# Patient Record
Sex: Male | Born: 1940 | Race: White | Hispanic: No | Marital: Married | Smoking: Former smoker
Health system: Southern US, Community
[De-identification: ages and names within clinical notes are randomized; demographics above are authoritative.]

## PROBLEM LIST (undated history)

## (undated) DIAGNOSIS — M549 Dorsalgia, unspecified: Secondary | ICD-10-CM

---

## 1998-01-21 ENCOUNTER — Ambulatory Visit (HOSPITAL_COMMUNITY): Admission: RE | Admit: 1998-01-21 | Discharge: 1998-01-21 | Payer: Self-pay | Admitting: Urology

## 1998-10-16 ENCOUNTER — Ambulatory Visit (HOSPITAL_COMMUNITY): Admission: RE | Admit: 1998-10-16 | Discharge: 1998-10-16 | Payer: Self-pay | Admitting: Gastroenterology

## 1999-03-03 ENCOUNTER — Ambulatory Visit (HOSPITAL_COMMUNITY): Admission: RE | Admit: 1999-03-03 | Discharge: 1999-03-03 | Payer: Self-pay | Admitting: Urology

## 1999-11-29 ENCOUNTER — Encounter (HOSPITAL_COMMUNITY): Payer: Self-pay | Admitting: Oncology

## 1999-11-29 ENCOUNTER — Ambulatory Visit (HOSPITAL_COMMUNITY): Admission: RE | Admit: 1999-11-29 | Discharge: 1999-11-29 | Payer: Self-pay | Admitting: Oncology

## 2000-01-15 ENCOUNTER — Ambulatory Visit (HOSPITAL_COMMUNITY): Admission: RE | Admit: 2000-01-15 | Discharge: 2000-01-15 | Payer: Self-pay | Admitting: Gastroenterology

## 2000-01-15 ENCOUNTER — Encounter (INDEPENDENT_AMBULATORY_CARE_PROVIDER_SITE_OTHER): Payer: Self-pay | Admitting: Specialist

## 2003-02-26 ENCOUNTER — Ambulatory Visit (HOSPITAL_COMMUNITY): Admission: RE | Admit: 2003-02-26 | Discharge: 2003-02-26 | Payer: Self-pay | Admitting: Gastroenterology

## 2003-02-26 ENCOUNTER — Encounter (INDEPENDENT_AMBULATORY_CARE_PROVIDER_SITE_OTHER): Payer: Self-pay | Admitting: Specialist

## 2003-08-08 ENCOUNTER — Ambulatory Visit (HOSPITAL_COMMUNITY): Admission: RE | Admit: 2003-08-08 | Discharge: 2003-08-08 | Payer: Self-pay | Admitting: General Surgery

## 2008-05-18 ENCOUNTER — Inpatient Hospital Stay (HOSPITAL_COMMUNITY): Admission: RE | Admit: 2008-05-18 | Discharge: 2008-05-22 | Payer: Self-pay | Admitting: Specialist

## 2009-03-15 ENCOUNTER — Inpatient Hospital Stay (HOSPITAL_COMMUNITY): Admission: RE | Admit: 2009-03-15 | Discharge: 2009-03-18 | Payer: Self-pay | Admitting: Specialist

## 2009-04-12 ENCOUNTER — Encounter: Admission: RE | Admit: 2009-04-12 | Discharge: 2009-04-12 | Payer: Self-pay | Admitting: Specialist

## 2009-05-22 ENCOUNTER — Ambulatory Visit (HOSPITAL_BASED_OUTPATIENT_CLINIC_OR_DEPARTMENT_OTHER): Admission: RE | Admit: 2009-05-22 | Discharge: 2009-05-22 | Payer: Self-pay | Admitting: Orthopedic Surgery

## 2010-10-15 LAB — POCT HEMOGLOBIN-HEMACUE: Hemoglobin: 15 g/dL (ref 13.0–17.0)

## 2010-10-17 LAB — CBC
Hemoglobin: 13.6 g/dL (ref 13.0–17.0)
MCHC: 33.5 g/dL (ref 30.0–36.0)
MCHC: 33.5 g/dL (ref 30.0–36.0)
MCV: 95 fL (ref 78.0–100.0)
MCV: 95.2 fL (ref 78.0–100.0)
Platelets: 186 10*3/uL (ref 150–400)
RBC: 4.14 MIL/uL — ABNORMAL LOW (ref 4.22–5.81)
RBC: 4.24 MIL/uL (ref 4.22–5.81)
RDW: 13.2 % (ref 11.5–15.5)
WBC: 10.3 10*3/uL (ref 4.0–10.5)
WBC: 10.8 10*3/uL — ABNORMAL HIGH (ref 4.0–10.5)

## 2010-10-17 LAB — BASIC METABOLIC PANEL
BUN: 21 mg/dL (ref 6–23)
CO2: 28 mEq/L (ref 19–32)
CO2: 29 mEq/L (ref 19–32)
Calcium: 8.2 mg/dL — ABNORMAL LOW (ref 8.4–10.5)
Calcium: 8.4 mg/dL (ref 8.4–10.5)
Chloride: 104 mEq/L (ref 96–112)
Creatinine, Ser: 1.06 mg/dL (ref 0.4–1.5)
Creatinine, Ser: 1.1 mg/dL (ref 0.4–1.5)
GFR calc Af Amer: >60 mL/min (ref >60)
GFR calc non Af Amer: >60 mL/min (ref >60)
Glucose, Bld: 153 mg/dL — ABNORMAL HIGH (ref 70–99)
Sodium: 133 mEq/L — ABNORMAL LOW (ref 135–145)

## 2010-10-17 LAB — CROSSMATCH: ABO/RH(D): A NEG

## 2010-10-18 LAB — PROTIME-INR
INR: 1 (ref 0.00–1.49)
Prothrombin Time: 13.1 seconds (ref 11.6–15.2)

## 2010-10-18 LAB — COMPREHENSIVE METABOLIC PANEL
BUN: 32 mg/dL — ABNORMAL HIGH (ref 6–23)
CO2: 31 mEq/L (ref 19–32)
Chloride: 104 mEq/L (ref 96–112)
Creatinine, Ser: 1.21 mg/dL (ref 0.4–1.5)
GFR calc non Af Amer: 59 mL/min — ABNORMAL LOW (ref >60)
Total Bilirubin: 1.2 mg/dL (ref 0.3–1.2)

## 2010-10-18 LAB — URINALYSIS, ROUTINE W REFLEX MICROSCOPIC
Ketones, ur: NEGATIVE mg/dL
Nitrite: NEGATIVE
Protein, ur: NEGATIVE mg/dL

## 2010-10-18 LAB — CBC
HCT: 47.4 % (ref 39.0–52.0)
MCHC: 33.7 g/dL (ref 30.0–36.0)
MCV: 95 fL (ref 78.0–100.0)
Platelets: 201 10*3/uL (ref 150–400)
WBC: 8.7 10*3/uL (ref 4.0–10.5)

## 2010-10-18 LAB — DIFFERENTIAL
Basophils Absolute: 0 10*3/uL (ref 0.0–0.1)
Lymphocytes Relative: 18 % (ref 12–46)
Neutro Abs: 6.3 10*3/uL (ref 1.7–7.7)

## 2010-10-18 LAB — APTT: aPTT: 27 seconds (ref 24–37)

## 2010-11-25 NOTE — H&P (Signed)
[name], Norris NO.:  [id number]   MEDICAL RECORD NO.:  [id number]         PATIENT TYPE:  LINP   LOCATION:                               FACILITY:  [facility]   PHYSICIAN:  Erasmo Leventhal, M.D.DATE OF BIRTH:  07-06-1941   DATE OF ADMISSION:  05/18/2008  DATE OF DISCHARGE:                              HISTORY & PHYSICAL   CHIEF COMPLAINTS:  Right knee end-stage osteoarthritis.   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 Due to continued problems he is now  scheduled for total knee arthroplasty.  The surgeries, benefits and  aftercare were discussed in detail with the patient, questions invited  and answered and surgery to go ahead as scheduled.  He has had medical  clearance by Dr. Daphane Shepherd of the [facility].   PAST MEDICAL HISTORY:  Drug allergies:  Codeine with nausea and  vomiting.   CURRENT MEDICATIONS:  1. Meloxicam 15 mg daily.  2. Gabapentin 100 mg 3 tablets t.i.d.  3. Imodium pill, two 2 pills p.r.n. up to 10 pills a day.   PAST SURGICAL HISTORY:  Positive for history of colon resection for  colon cancer, right knee and right shoulder. He has not had any cataract  surgery as of yet, but does have cataracts.   Serious medical illnesses include colon cancer and neuropathy.   FAMILY HISTORY:  Positive for cancer, coronary artery disease and  diabetes.   SOCIAL HISTORY:  The patient is married.  He is retired.  He does not  smoke and does not drink.   REVIEW OF SYSTEMS:  CENTRAL NERVOUS SYSTEM: Negative for headache,  blurred vision or dizziness.  PULMONARY:  Negative for shortness breath,  PND and orthopnea.  CARDIOVASCULAR:  No chest pain or palpitation.  GI:  Negative for ulcers or hepatitis.  Positive for history of colon cancer  with colon resection and diverticulitis.  GU: Negative for urinary tract   difficulty.  MUSCULOSKELETAL:  Positive in HPI.   PHYSICAL EXAM:  VITAL SIGNS:  BP 120/72, respirations 16, pulse 72 and  regular.  GENERAL APPEARANCE:  This is a well-nourished gentleman in no acute  distress.  HEENT:  Head normocephalic.  Nose patent.  Ears patent.  Pupils equal,  round, react to light.  Throat without injection.  NECK:  Supple without adenopathy.  Carotids 2+ without bruit.  CHEST: Clear to auscultation without rales or rhonchi.  Respirations 16.  HEART:  Regular rate and rhythm at 72 beats per without murmur.  ABDOMEN: Soft with active bowel sounds.  No masses, organomegaly.  NEUROLOGIC:  Patient alert and oriented to time, place and person.  Cranial nerves II-XII grossly intact.  EXTREMITIES:  Shows 3 degrees flexion contracture with further flexion  to 135 degrees of varus deformity.  Neurovascular status intact.  Dorsalis pedis, posterior tibialis pulses are 1+.   X-rays show end-stage osteoarthritis right knee.   IMPRESSION:  End-stage osteoarthritis right knee.   PLAN:  Total knee arthroplasty right knee.  Jaquelyn Bitter. Chabon, P.A.    ______________________________  Erasmo Leventhal, M.D.    SJC/MEDQ  D:  05/07/2008  T:  05/07/2008  Job:  [id number]

## 2010-11-25 NOTE — Discharge Summary (Signed)
[name] Norris, [name] Norris              ACCOUNT NO.:  [id number]   MEDICAL RECORD NO.:  [id number]          PATIENT TYPE:  INP   LOCATION:  1616                         FACILITY:  [facility]   PHYSICIAN:  Erasmo Leventhal, M.D.DATE OF BIRTH:  1941/05/15   DATE OF ADMISSION:  05/18/2008  DATE OF DISCHARGE:  05/22/2008                               DISCHARGE SUMMARY   ADMITTING DIAGNOSIS:  End-stage osteoarthritis, right knee.   DISCHARGE DIAGNOSIS:  End-stage osteoarthritis, right knee.   OPERATION:  Total knee arthroplasty right knee.   BRIEF HISTORY:  This is a 70 year old gentleman with a history of end-  stage osteoarthritis in his right knee with failure of conservative  management to alleviate his pain and discomfort.  After discussion of  treatment, benefits, risks and options, the patient now scheduled for  total knee arthroplasty.   LABORATORY VALUES:  Admission CBC within normal limits with the  exception of a high hemoglobin of 17.1, admission urinalysis within  normal limits, admission PT/PTT within normal limits, admitting CMET  showed glucose high at 102, BUN high at 26.  Hemoglobin and hematocrit  reached a low of 13.4 and 40.4 on the ninth, BMET showed mildly elevated  glucose in the 120-140 range through admission.  Repeat urinalysis  showed 30 of protein and 2 urobilinogen.  Chest x-ray on admission  within normal limits, repeat on the ninth within normal limits.   COURSE IN THE HOSPITAL:  The patient tolerated the operative procedure  well.  First postoperative day vital signs were stable, temperature  99.2, hemoglobin and hematocrit normal, dressing was dry, drain was  removed without difficulty and physical therapy and CPM were started.  Second postoperative day vital signs stable, temperature to 102.1, lungs  clear, heart sounds normal, bowel sounds active, some mild erythema  about the wound, calves were negative, he was started on incentive  spirometry cough  and deep breathe.  Third postoperative day he was  feeling good, vital signs stable, temperature to 101.1, O2 - 95 on room  air, WBC 9.6, hemoglobin and hematocrit normal, lungs clear, heart  sounds normal, bowel sounds active, calves negative, wound was benign,  urinalysis was checked to make sure there was not a UTI and there was no  evidence of UTI.  Chest x-ray was also obtained and showed no  atelectasis or abnormality.  On the fourth postoperative day the patient  was feeling good, his vital signs stable, afebrile, chest x-ray normal,  lungs clear, heart sounds normal, bowel sounds active, calves negative,  wound was benign and he had been afebrile through the night.  And the  patient is subsequently discharged home for follow-up in the office.   CONDITION ON DISCHARGE:  Improved.   DISCHARGE MEDICATIONS:  1. Oxycodone 5 mg one to two q.6 hours p.r.n. pain.  2. Robaxin 500 one p.o. q.8 hours p.r.n. spasm.  3. Lovenox 30 mg one shot each day at 7:00 a.m. and 7:00 p.m.  4. Celebrex 200 mg one pill a day until he is off the Lovenox then he      can go back on  his Mobic.   DISCHARGE INSTRUCTIONS:  He is to use his crutches to walk and do his  home therapy.  Keep his wound clean and dry for 3 weeks.  Return to the  office in 2 weeks for follow-up or call sooner p.r.n. problems.   DISCHARGE DIET:  Regular, heart healthy diet.      Jaquelyn Bitter. Chabon, P.A.    ______________________________  Erasmo Leventhal, M.D.    SJC/MEDQ  D:  05/22/2008  T:  05/22/2008  Job:  [id number]

## 2010-11-25 NOTE — H&P (Signed)
NAME:  [name] Norris, [name] Norris             ACCOUNT NO.:  [id number]   MEDICAL RECORD NO.:  [id number]         PATIENT TYPE:  LINP   LOCATION:                               FACILITY:  [facility]   PHYSICIAN:  Robert Andrew Collins, M.D.DATE OF BIRTH:  06/25/1941   DATE OF ADMISSION:  05/18/2008  DATE OF DISCHARGE:                              HISTORY & PHYSICAL   CHIEF COMPLAINTS:  Right knee end-stage osteoarthritis.   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 Due to continued problems he is now  scheduled for total knee arthroplasty.  The surgeries, benefits and  aftercare were discussed in detail with the patient, questions invited  and answered and surgery to go ahead as scheduled.  He has had medical  clearance by Dr. Meyer of the [facility].   PAST MEDICAL HISTORY:  Drug allergies:  Codeine with nausea and  vomiting.   CURRENT MEDICATIONS:  1. Meloxicam 15 mg daily.  2. Gabapentin 100 mg 3 tablets t.i.d.  3. Imodium pill, two 2 pills p.r.n. up to 10 pills a day.   PAST SURGICAL HISTORY:  Positive for history of colon resection for  colon cancer, right knee and right shoulder. He has not had any cataract  surgery as of yet, but does have cataracts.   Serious medical illnesses include colon cancer and neuropathy.   FAMILY HISTORY:  Positive for cancer, coronary artery disease and  diabetes.   SOCIAL HISTORY:  The patient is married.  He is retired.  He does not  smoke and does not drink.   REVIEW OF SYSTEMS:  CENTRAL NERVOUS SYSTEM: Negative for headache,  blurred vision or dizziness.  PULMONARY:  Negative for shortness breath,  PND and orthopnea.  CARDIOVASCULAR:  No chest pain or palpitation.  GI:  Negative for ulcers or hepatitis.  Positive for history of colon cancer  with colon resection and diverticulitis.  GU: Negative for urinary tract   difficulty.  MUSCULOSKELETAL:  Positive in HPI.   PHYSICAL EXAM:  VITAL SIGNS:  BP 120/72, respirations 16, pulse 72 and  regular.  GENERAL APPEARANCE:  This is a well-nourished gentleman in no acute  distress.  HEENT:  Head normocephalic.  Nose patent.  Ears patent.  Pupils equal,  round, react to light.  Throat without injection.  NECK:  Supple without adenopathy.  Carotids 2+ without bruit.  CHEST: Clear to auscultation without rales or rhonchi.  Respirations 16.  HEART:  Regular rate and rhythm at 72 beats per without murmur.  ABDOMEN: Soft with active bowel sounds.  No masses, organomegaly.  NEUROLOGIC:  Patient alert and oriented to time, place and person.  Cranial nerves II-XII grossly intact.  EXTREMITIES:  Shows 3 degrees flexion contracture with further flexion  to 135 degrees of varus deformity.  Neurovascular status intact.  Dorsalis pedis, posterior tibialis pulses are 1+.   X-rays show end-stage osteoarthritis right knee.   IMPRESSION:  End-stage osteoarthritis right knee.   PLAN:  Total knee arthroplasty right knee.        Stephen J. Chabon, P.A.    ______________________________  Robert Andrew Collins, M.D.    SJC/MEDQ  D:  05/07/2008  T:  05/07/2008  Job:  [id number] 

## 2010-11-25 NOTE — Op Note (Signed)
NAMEDAVION, FLANNERY              ACCOUNT NO.:  [id number]   MEDICAL RECORD NO.:  [id number]          PATIENT TYPE:  INP   LOCATION:  1616                         [facility]ACILITY:  [facility]   PHYSICIAN:  Erasmo Leventhal, M.D.DATE OF BIRTH:  03-15-1941   DATE OF PROCEDURE:  05/18/2008  DATE OF DISCHARGE:                               OPERATIVE REPORT   PREOPERATIVE DIAGNOSIS:  Right knee end-stage osteoarthritis.   POSTOPERATIVE DIAGNOSIS:  Right knee end-stage osteoarthritis.   PROCEDURE:  Right total knee arthroplasty.   SURGEON:  Dr. Valma Cava.   ASSISTANT:  Jodene Nam, PA-C.   ANESTHESIA:  Spinal monitored anesthesia.   BLOOD LOSS:  Less than 150 mL.   DRAINS:  One medium Hemovac.   COMPLICATIONS:  None.   TOURNIQUET TIME:  70 minutes at 300 mmHg.   DISPOSITION:  To PACU stable.   OPERATIVE DETAILS:  The family and patient were counseled in the holding  area and patient and surgery site identified.  IV was started.  The  patient was taken to the operating room where spinal anesthetic was  administered.  IV antibiotics were given.  Foley catheter placed  utilizing sterile technique by OR circulating nurse.  Right lower  extremity elevated and prepped with DuraPrep and draped in sterile  fashion.  The Esmarch tourniquet was inflated to for 300 mmHg.  Straight  midline incision was made in the skin and subcutaneous tissue.  Medial  soft tissue flaps were developed.  Medial parapatellar arthrotomy was  performed.  Proximal medial tibia soft tissue release was done.  Knee  was flexed.  End-stage arthritis changes and severe chondrocalcinosis  osteoarthritis were removed.  Cruciate ligaments were resected.  A  starter hole was made in distal femur.  Canal was irrigated and the  effluent was clear.  Intramedullary rod was gently placed.  I chose a 5-  degree valgus cut and it took a 10-mm cup to the distal femur due to the  flexion contracture.  This was found be a  size #5.  Rotational marks  were made and set appropriately and a cutting block was applied.  With  bur size #5 medial and lateral meniscal remnants were removed.  Proximal  osteophytes were removed from proximal tibia.  Utilizing extramedullary  alignment rod we accepted  a  10 mm cut off the least deficient side  which was the lateral side and had a 0-degree slope.  Posteromedial and  posterior femoral osteophytes removed under direct visualization.  Posterior  intravascular structures were tied  off and protected  throughout the entire case.  Flexion/extension blocks were 12.5 with  well-balanced flexion and extension.  The knee was then flexed to the  base and plate was applied.  Rotation coverage were set for size #5.  Reamer and punch was performed and femoral box cut for was performed,  and fit with a size-5 femur and size-5 tibia.  A 12.5-posterior  stabilized rotating platform tibial insert was implanted. We had well  balanced flexion- extension gaps and was stable to varus and valgus  stress throughout 0-90 degrees of flexion.  Patella was found be a size  #41 and appropriate bone resected.  Lock holes were made in the patella  that was supplied and patellofemoral track was anatomic.  All trials  were then removed.  The knee was irrigated with pulsatile lavage.  Cement was mixed utilizing modern  cement technique d all components  were cemented in place, size-5 tibia, size-5 femur, size-41 patella.  After cemented had cured and excess had been removed with trials in  place, we had well balanced 15 mm.  The knee was then flexed and  irrigated again and a 15-mm posterior stabilized rotating platform  tibial insert was implanted.  The medium Hemovac drain was placed.  The  knee was checked, flexed in extension and was well balanced.  Full  extension and varus valgus stress was stable.  Sequential closure was  done.  The arthrotomy was closed at 90 degrees at flexion with Vicryl   figure-of-eight, the subcu with Vicryl and the skin with a subcuticular  with Monopril suture.  Steri-Strips were applied.  Drains were hooked to  suction.  A sterile compressive dressing was applied.  The tourniquet  was deflated with normal circulation of the foot and ankle at the end of  the case.  Another gram of Ancef was given intravenously.  The patient  tolerated the procedure well.  There were no complications or problems.  He was taken from the operating room to PACU  in stable condition.   Help with surgical technique and decision making:  Mr. Jodene Nam,  PA-C assistance was needed throughout the entire case.   Implants implanted:  [employer] Sigma.  Size-5 femur, size-  5 tibia, size-41-mm patella and a 15-mm posterior stabilized rotating  platform tibial insert.           ______________________________  Erasmo Leventhal, M.D.     RAC/MEDQ  D:  05/18/2008  T:  05/18/2008  Job:  [id number]

## 2010-11-25 NOTE — H&P (Signed)
[name] Norris, [name] Norris              ACCOUNT NO.:  [id number]   MEDICAL RECORD NO.:  [id number]          PATIENT TYPE:  INP   LOCATION:  NA                           FACILITY[facility]   PHYSICIAN:  Erasmo Leventhal, M.D.DATE OF BIRTH:  12/17/1940   DATE OF ADMISSION:  03/15/2009  DATE OF DISCHARGE:                              HISTORY & PHYSICAL   CHIEF COMPLAINT:  Painful range of motion, left knee.   HISTORY OF PRESENT ILLNESS:  [name] Norris is a 70 year old gentleman with  painful range of motion of his left knee.  He has failed conservative  treatment.  Patient has elected to proceed with a total knee  arthroplasty on his left.  He has recently gone through a total knee  arthroplasty on his right with excellent results.  Patient's x-rays  reveal he has tricompartmental arthritic changes with bone-on-bone  medial compartment with a varus deformity.   ALLERGIES:  1. CODEINE CAUSING NAUSEA.  2. PERCOCET CAUSING VERTIGO.  3. LATEX ALLERGY CAUSING ITCHING AND RASH.  4. LAUNDRY SOAPS AND DYES CAUSING ITCHING AND RASH.   CURRENT MEDICATIONS:  1. Gabapentin 300 three times a day.  2. 2.  Meloxicam.  3. Imodium.  4. Fish oil.  5. Glucosamine chondroitin.  6. Vitamin C and B supplements.  7. Multivitamins.  8. Calcium and vitamin D.  9. Black/red cherry and orange juice.   PRIMARY CARE PHYSICIAN:  Dr. Lenise Arena with [facility].   REVIEW OF SYSTEMS:  NEUROLOGIC:  He does have some visual issues.  He  has also had vertigo in the past related to his oxycodone medication.  He also has cataracts.  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  CARDIOVASCULAR:  Unremarkable.  GI:  He does have a history of diverticulosis in the past  which was treated when he had his colon resection due to colon cancer.  GU:  He does have some urinary incontinence but no recent infections or  kidney stones.  ENDOCRINE:  Unremarkable.   He has had a transfusion in  the past in 1997 with his colon surgery.   PAST SURGICAL HISTORY:  Includes:  1. Colon resection due to colon cancer.  2. Right shoulder and knee surgery.  3. He had a right total knee arthroplasty.  No problems with      anesthesia in the past.   FAMILY MEDICAL HISTORY:  Father has a history of colon cancer.  Mother  has a history of heart disease.  [name] Norris is diabetic.  [name] Norris is  diabetic.   SOCIAL HISTORY:  The patient is currently married, lives with his [name] Norris  in a 1-story house.  He is a retired [employer].  He has smoked in the  past.  He occasionally has an alcoholic beverage.  He has 2 grown  children.   PHYSICAL EXAM:  VITALS:  Height is 5 feet 10.  Weight is 168 pounds.  Blood pressure is 148/72.  Pulse of 74 and regular.  Respirations 12.  Patient is afebrile.  GENERAL:  This is a healthy-appearing, well-developed gentleman,  conscious, alert, and appropriate.  Appears to be a good historian.  Easily gets on and off the exam table.  HEENT:  Head was normocephalic.  Pupils equal, round, and reactive.  NECK:  Supple.  No palpable lymphadenopathy.  Good range of motion.  CHEST:  Lung sounds were clear and equal bilaterally.  No wheezes,  rales, rhonchi.  HEART:  Regular rate and rhythm.  No murmurs.  ABDOMEN:  Soft, nontender.  Bowel sounds present.  EXTREMITIES:  Upper extremities had excellent range of motion without  any difficulty.  Good motor strength.  Lower extremities, he had good  range of motion in both hips.  His right knee had full extension and he  was able to flex it back to 130.  Well-healed anterior surgical  incision.  His left knee, he lacked about 5 degrees extension.  He was  able to flex it back to about 120.  He had an obvious varus deformity.  No signs of infection.  His calves were soft and nontender.  No signs of  phlebitis.  NEURO:  Patient was conscious, alert, and appropriate.  PERIPHERAL VASCULAR:  Carotid  pulses were 2+.  Radial pulses 2+.  Posterior tibial pulses were 2+.  BREAST:  Exam was deferred.  RECTAL:  Exam was deferred.  GU:  Exam was deferred.   IMPRESSION:  1. End-stage osteoarthritis, left knee, with bone-on-bone medial      compartment.  2. 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  3. History of colon cancer, status post resection.  4. Some urinary incontinence.  5. History of vertigo with previous oxycodone use.   PLAN:  Patient will undergo all routine labs and tests prior to having a  left total knee arthroplasty by Dr. Thomasena Edis at [facility] on  March 15, 2009.      Jamelle Rushing, P.A.    ______________________________  Erasmo Leventhal, M.D.    RWK/MEDQ  D:  03/04/2009  T:  03/04/2009  Job:  [id number]   cc:   Erasmo Leventhal, M.D.  Fax: [phone]

## 2010-11-28 NOTE — Procedures (Signed)
[facility]  Patient:    [name] Norris, [name] Norris                       MRN: [id number] Proc. Date: 01/15/00 Attending:  Verlin Grills, M.D. CC:         Samul Dada, M.D.             Chales Salmon. Abigail Miyamoto, M.D.             Angelia Mould. Derrell Lolling, M.D.                           Procedure Report  REFERRING PHYSICIAN: 1. Samul Dada, M.D. 2. Chales Salmon. Abigail Miyamoto, M.D.  PROCEDURE PERFORMED:  Colonoscopy with biopsy.  ENDOSCOPIST:  Verlin Grills, M.D.  INDICATIONS FOR PROCEDURE:  [name] Norris is a 70 year old male.  In 1992 he underwent a segmental sigmoid colon resection to treat a perforated sigmoid diverticulum.  In 1997 he underwent a segmental transverse colon resection to remove an adenocarcinoma of the colon.  [name] Norris is scheduled for surveillance colonoscopy to remove neoplastic colon polyps to prevent recurrent colon cancer.  I discussed with [name] Norris the complications associated with colonoscopy and polypectomy including intestinal bleeding and intestinal perforation. [name] Norris has signed the operative permit.  PREMEDICATION:  Demerol 50 mg, Versed 5 mg.  ENDOSCOPE:  Pediatric Olympus colonoscope.  DESCRIPTION OF PROCEDURE:  After obtaining informed consent, the patient was placed in the left lateral decubitus position.  I administered intravenous Demerol and intravenous Versed to achieve conscious sedation for the procedure.  The patients blood pressure, oxygen saturation and cardiac rhythm were monitored throughout the procedure and documented in the medical record.  Anal inspection was normal.  Digital rectal exam revealed a nonnodular prostate.  The Olympus pediatric video colonoscope was then introduced into the rectum and under direct vision, advanced to the cecum as identified by a normal-appearing ileocecal valve.  The ileocecal valve was intubated and the distal 10 cm of ileum inspected.  Colonic  preparation for the exam today was satisfactory.  Rectum:  Normal.  Sigmoid colon, descending colon, and left colonic anastomosis:  There are a few small colonic diverticula.  Biopsies were taken from the left colonic anastomosis which appears patent and normal.  Splenic flexure:  Normal.  Transverse colon:  Biopsies were taken from the anastomosis in the right colon which is patent and appears unremarkable.  Hepatic flexure:  Normal.  Ascending colon:  Normal.  Cecum and ileocecal valve:  Normal.  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  Biopsies taken from the right surgical anastomosis and the left colon surgical anastomosis are pending.  The patient does have small diverticula in the remaining left colon.  RECOMMENDATIONS:  Repeat colonoscopy in July 2004. DD:  01/15/00 TD:  01/15/00 Job: [id number]

## 2010-11-28 NOTE — Op Note (Signed)
[name] Norris, [name] Norris                        ACCOUNT NO.:  [id number]   MEDICAL RECORD NO.:  [id number]                   PATIENT TYPE:  OIB   LOCATION:  NA                                   FACILITY:  [facility]   PHYSICIAN:  Angelia Mould. Derrell Lolling, M.D.             DATE OF BIRTH:  1940-11-17   DATE OF PROCEDURE:  08/08/2003  DATE OF DISCHARGE:                                 OPERATIVE REPORT   PREOPERATIVE DIAGNOSIS:  Left inguinal hernia.   POSTOPERATIVE DIAGNOSIS:  Left inguinal hernia.   OPERATION PERFORMED:  Repair of left inguinal hernia with mesh Armanda Heritage  repair).   SURGEON:  Angelia Mould. Derrell Lolling, M.D.   OPERATIVE INDICATIONS:  This is a 70 year old white man who recently about 2  months ago felt something pop in his left groin and then felt a bulge and  some burning discomfort.  This has been bothersome on a daily basis.  On  exam, he has a moderately large left inguinal hernia.  He has a right groin  scar from previous hernia surgery, but there is no evidence of recurrence.  He has a large abdominal incision from a ventral herniorrhaphy, and there is  no ventral hernia.  He is brought to the operating room electively for  repair of his left inguinal hernia.   OPERATIVE TECHNIQUE:  Following the induction of a general LMA anesthesia,  the patient's left groin was prepped and draped in a sterile fashion.  0.5%  Marcaine with epinephrine was used as a local infiltration anesthetic.  An  oblique incision was made in the left groin overlying the inguinal canal.  Dissection was carried down through the subcutaneous tissue, exposing the  aponeurosis of the external oblique.  The external oblique was incised in  the direction of its fibers, opening up the external inguinal ring.  A small  sensory nerve consistent of variant of iliohypogastric nerve was intimately  involved in the cord and the inguinal canal and was dissected back to its  penetration of the muscle, clamped, divided,  and ligated with a 2-0 silk  tie.  Self-retaining retractors were placed.  The cord structures were  mobilized and encircled with a Penrose drain.  I found that the patient had  a direct inguinal hernia and an indirect inguinal hernia.  The direct hernia  had fatty tissue protruding through the direct hernia space.  This was  cleaned off and then reduced and the soft tissues oversewn with a figure-of-  eight suture of 2-0 Vicryl.  The indirect hernia sac was dissected away from  the cord structures all the way back to the internal ring.  The hernia sac  was opened and inspected.  The sac was empty and found to be a simple sac.  It was then twisted and suture ligated at the level of the internal ring  with a suture ligature of 2-0 silk.  A lipoma of the cord  was also resected.  The floor of the inguinal canal was repaired and reinforced with an onlay  graft of polypropylene mesh.  A 3 inch x 6 inch of polypropylene mesh was  used and trimmed at the corners.  The mesh was sutured in place with running  sutures of 2-0 Prolene.  The mesh was sutured so as to generously overlap  the fascia at the pubic tubercle, along the inguinal ligament inferiorly and  along the conjoined tendon and internal oblique superiorly.  The mesh was  incised laterally so as to wrap around the cord structures at the internal  ring.  The tails of the mesh were overlapped laterally and the suture lines  completed.  A couple of extra sutures of 2-0 Prolene were placed to snug up  the mesh around the cord structures.  This created a very secure repair,  both medial and lateral to the internal ring but allowed an adequate opening  for the cord structures.  The wound was irrigated with saline.  Hemostasis  was excellent.  The external oblique was closed with a running suture of 2-0  Vicryl and placed in the cord structures deep to the external oblique.  The  Scarpa's fascia was closed with 3-0 Vicryl and the skin closed with  a  running subcuticular suture of 4-0 Vicryl and Steri-Strips.  Clean bandages  were placed and the patient taken to the recovery room in a stable  condition.  Estimated blood loss was about 10 mL.   COMPLICATIONS:  None.   SPONGE, NEEDLE, AND INSTRUMENT COUNTS:  Correct.                                               Angelia Mould. Derrell Lolling, M.D.    HMI/MEDQ  D:  08/08/2003  T:  08/08/2003  Job:  [id number]   cc:   Chales Salmon. Abigail Miyamoto, M.D.  [address]  [place]  [place] [address]  Fax: [phone]

## 2010-11-28 NOTE — Op Note (Signed)
NAMEBRAYEN, BUNN                        ACCOUNT NO.:  [id number]   MEDICAL RECORD NO.:  [id number]                   PATIENT TYPE:  AMB   LOCATION:  ENDO                                 FACILITY:  [facility]   PHYSICIAN:  [name], M.D.                DATE OF BIRTH:  10-24-1940   DATE OF PROCEDURE:  02/26/2003  DATE OF DISCHARGE:                                 OPERATIVE REPORT   PROCEDURE:  Screening colonoscopy with polypectomy.   ENDOSCOPIST:  [name] Norris, M.D.   INDICATIONS FOR PROCEDURE:  [name] Norris is a 70 year old male born  [birth year].  In 1992, [name] Norris underwent a segmental sigmoid colon  resection to treat a perforated sigmoid diverticulum.  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  [name] Norris is here for a surveillance colonoscopy with polypectomy to prevent  recurrent colon cancer.   PREMEDICATION:  Versed 5mg , Demerol 60 mg.   DESCRIPTION OF PROCEDURE:  After obtaining the informed consent, [name] Norris  was placed in the left lateral decubitus position.  I administered  intravenous Demerol and intravenous Versed to achieve conscious sedation  before the procedure.  The patient's blood pressure, oxygen saturation, and  cardiac rhythm were monitored throughout the procedure and documented in the  medical record.   Anal inspection was normal.  Digital rectal exam revealed a small nonnodular  prostate.  The Olympus adult colonoscope was introduced into the rectum and  easily advanced to the cecum.  A normal appearing ileocecal valve was  intubated, and the distal ileum inspected.  Colonic preparation for the exam  today was excellent.   Rectum normal.   Sigmoid colon and descending colon:  Normal post segmental sigmoid colon  resection for perforated diverticulum.  Diverticula are present in the left  colon.  At 40 cm from the anal verge, a 2 mm sessile polyp was removed with  electrocautery snare and submitted for pathologic interpretation.   Splenic flexure normal.   Transverse colon normal.   Hepatic flexure normal.   Ascending colon normal.   Cecum and ileocecal valve normal.   Distal ileum normal.   ASSESSMENT:  1. In 1992, segmental sigmoid colon resection for perforated diverticulum.  2. 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  3. Colonic diverticulosis.  4. From the left colon at 40 cm from the anal verge, a 2 mm sessile polyp     was removed without electrocautery snare.   RECOMMENDATIONS:  Repeat surveillance colonoscopy in 5 years.                                               [name], M.D.    MJ/MEDQ  D:  02/26/2003  T:  02/26/2003  Job:  [id number]   cc:   Angelia Mould. Derrell Lolling, M.D.  [address], [address]  [place]  [place] [address]  Fax: [phone]   Chales Salmon. Abigail Miyamoto, M.D.  [address]  [place]  [place] [address]  Fax: [phone]   Samul Dada, M.D.  [address] [facility]  [place]  [place] [address]  Fax: [phone]

## 2011-04-14 LAB — DIFFERENTIAL
Basophils Relative: 1
Eosinophils Absolute: 0.1
Eosinophils Relative: 1
Lymphs Abs: 1.1
Monocytes Absolute: 0.6
Monocytes Relative: 8

## 2011-04-14 LAB — URINALYSIS, ROUTINE W REFLEX MICROSCOPIC
Bilirubin Urine: NEGATIVE
Glucose, UA: NEGATIVE
Hgb urine dipstick: NEGATIVE
Ketones, ur: NEGATIVE
Leukocytes, UA: NEGATIVE
Nitrite: NEGATIVE
Urobilinogen, UA: 0.2
pH: 6.5
pH: 7

## 2011-04-14 LAB — CBC
HCT: 39.6
HCT: 40
Hemoglobin: 13.1
Hemoglobin: 13.3
Hemoglobin: 13.4
MCHC: 33
MCHC: 33.3
MCHC: 34.2
MCV: 96
RBC: 4.17 — ABNORMAL LOW
RBC: 4.21 — ABNORMAL LOW
RBC: 5.33
RDW: 13.2
RDW: 13.7
WBC: 9.6

## 2011-04-14 LAB — BASIC METABOLIC PANEL
BUN: 16
CO2: 27
CO2: 28
Calcium: 8.1 — ABNORMAL LOW
Calcium: 8.1 — ABNORMAL LOW
Calcium: 8.4
Chloride: 103
Chloride: 103
Creatinine, Ser: 1.19
GFR calc Af Amer: >60
GFR calc Af Amer: >60
Glucose, Bld: 123 — ABNORMAL HIGH
Potassium: 4.4
Sodium: 135
Sodium: 135
Sodium: 137

## 2011-04-14 LAB — URINE MICROSCOPIC-ADD ON

## 2011-04-14 LAB — CROSSMATCH

## 2011-04-14 LAB — COMPREHENSIVE METABOLIC PANEL
ALT: 23
AST: 20
Alkaline Phosphatase: 48
Calcium: 10.3
GFR calc Af Amer: >60
Potassium: 4.9
Sodium: 141
Total Protein: 6.8

## 2011-04-14 LAB — CULTURE, BLOOD (ROUTINE X 2)

## 2011-04-14 LAB — PROTIME-INR: INR: 1

## 2011-12-16 ENCOUNTER — Other Ambulatory Visit: Payer: Self-pay

## 2011-12-28 ENCOUNTER — Other Ambulatory Visit: Payer: Self-pay

## 2014-07-23 DIAGNOSIS — R413 Other amnesia: Secondary | ICD-10-CM | POA: Diagnosis not present

## 2014-07-23 DIAGNOSIS — M159 Polyosteoarthritis, unspecified: Secondary | ICD-10-CM | POA: Diagnosis not present

## 2014-07-23 DIAGNOSIS — R5382 Chronic fatigue, unspecified: Secondary | ICD-10-CM | POA: Diagnosis not present

## 2014-12-20 DIAGNOSIS — R5382 Chronic fatigue, unspecified: Secondary | ICD-10-CM | POA: Diagnosis not present

## 2014-12-20 DIAGNOSIS — R413 Other amnesia: Secondary | ICD-10-CM | POA: Diagnosis not present

## 2014-12-20 DIAGNOSIS — F419 Anxiety disorder, unspecified: Secondary | ICD-10-CM | POA: Diagnosis not present

## 2014-12-20 DIAGNOSIS — H1132 Conjunctival hemorrhage, left eye: Secondary | ICD-10-CM | POA: Diagnosis not present

## 2014-12-20 DIAGNOSIS — M2550 Pain in unspecified joint: Secondary | ICD-10-CM | POA: Diagnosis not present

## 2014-12-24 DIAGNOSIS — Z1389 Encounter for screening for other disorder: Secondary | ICD-10-CM | POA: Diagnosis not present

## 2014-12-24 DIAGNOSIS — L639 Alopecia areata, unspecified: Secondary | ICD-10-CM | POA: Diagnosis not present

## 2014-12-24 DIAGNOSIS — N183 Chronic kidney disease, stage 3 (moderate): Secondary | ICD-10-CM | POA: Diagnosis not present

## 2014-12-28 DIAGNOSIS — L639 Alopecia areata, unspecified: Secondary | ICD-10-CM | POA: Diagnosis not present

## 2014-12-28 DIAGNOSIS — N183 Chronic kidney disease, stage 3 (moderate): Secondary | ICD-10-CM | POA: Diagnosis not present

## 2015-01-22 DIAGNOSIS — R413 Other amnesia: Secondary | ICD-10-CM | POA: Diagnosis not present

## 2015-02-01 DIAGNOSIS — L639 Alopecia areata, unspecified: Secondary | ICD-10-CM | POA: Diagnosis not present

## 2015-02-01 DIAGNOSIS — H539 Unspecified visual disturbance: Secondary | ICD-10-CM | POA: Diagnosis not present

## 2015-02-01 DIAGNOSIS — M159 Polyosteoarthritis, unspecified: Secondary | ICD-10-CM | POA: Diagnosis not present

## 2015-02-01 DIAGNOSIS — N183 Chronic kidney disease, stage 3 (moderate): Secondary | ICD-10-CM | POA: Diagnosis not present

## 2015-02-28 DIAGNOSIS — G8929 Other chronic pain: Secondary | ICD-10-CM | POA: Diagnosis not present

## 2015-02-28 DIAGNOSIS — M159 Polyosteoarthritis, unspecified: Secondary | ICD-10-CM | POA: Diagnosis not present

## 2015-03-12 DIAGNOSIS — H52223 Regular astigmatism, bilateral: Secondary | ICD-10-CM | POA: Diagnosis not present

## 2015-03-12 DIAGNOSIS — H5210 Myopia, unspecified eye: Secondary | ICD-10-CM | POA: Diagnosis not present

## 2015-03-20 ENCOUNTER — Ambulatory Visit: Payer: Commercial Managed Care - HMO | Attending: Psychology | Admitting: Psychology

## 2015-04-02 DIAGNOSIS — H43811 Vitreous degeneration, right eye: Secondary | ICD-10-CM | POA: Diagnosis not present

## 2015-04-02 DIAGNOSIS — H18413 Arcus senilis, bilateral: Secondary | ICD-10-CM | POA: Diagnosis not present

## 2015-04-02 DIAGNOSIS — H26492 Other secondary cataract, left eye: Secondary | ICD-10-CM | POA: Diagnosis not present

## 2015-04-02 DIAGNOSIS — Z961 Presence of intraocular lens: Secondary | ICD-10-CM | POA: Diagnosis not present

## 2015-04-04 DIAGNOSIS — N401 Enlarged prostate with lower urinary tract symptoms: Secondary | ICD-10-CM | POA: Diagnosis not present

## 2015-04-09 DIAGNOSIS — H26492 Other secondary cataract, left eye: Secondary | ICD-10-CM | POA: Diagnosis not present

## 2015-05-28 DIAGNOSIS — M25551 Pain in right hip: Secondary | ICD-10-CM | POA: Diagnosis not present

## 2015-05-28 DIAGNOSIS — M7061 Trochanteric bursitis, right hip: Secondary | ICD-10-CM | POA: Diagnosis not present

## 2015-05-28 DIAGNOSIS — M159 Polyosteoarthritis, unspecified: Secondary | ICD-10-CM | POA: Diagnosis not present

## 2015-06-18 DIAGNOSIS — N183 Chronic kidney disease, stage 3 (moderate): Secondary | ICD-10-CM | POA: Diagnosis not present

## 2015-06-18 DIAGNOSIS — M159 Polyosteoarthritis, unspecified: Secondary | ICD-10-CM | POA: Diagnosis not present

## 2015-07-11 ENCOUNTER — Emergency Department (HOSPITAL_COMMUNITY): Payer: Commercial Managed Care - HMO

## 2015-07-11 ENCOUNTER — Encounter (HOSPITAL_COMMUNITY): Payer: Self-pay

## 2015-07-11 ENCOUNTER — Emergency Department (HOSPITAL_COMMUNITY)
Admission: EM | Admit: 2015-07-11 | Discharge: 2015-07-11 | Disposition: A | Discharge status: Home / Self Care | Payer: Commercial Managed Care - HMO | Attending: Physician Assistant | Admitting: Physician Assistant

## 2015-07-11 DIAGNOSIS — Y9289 Other specified places as the place of occurrence of the external cause: Secondary | ICD-10-CM | POA: Diagnosis not present

## 2015-07-11 DIAGNOSIS — Z23 Encounter for immunization: Secondary | ICD-10-CM | POA: Insufficient documentation

## 2015-07-11 DIAGNOSIS — W108XXA Fall (on) (from) other stairs and steps, initial encounter: Secondary | ICD-10-CM | POA: Insufficient documentation

## 2015-07-11 DIAGNOSIS — S0993XA Unspecified injury of face, initial encounter: Secondary | ICD-10-CM | POA: Diagnosis present

## 2015-07-11 DIAGNOSIS — W19XXXA Unspecified fall, initial encounter: Secondary | ICD-10-CM

## 2015-07-11 DIAGNOSIS — S01511A Laceration without foreign body of lip, initial encounter: Secondary | ICD-10-CM | POA: Diagnosis not present

## 2015-07-11 DIAGNOSIS — S199XXA Unspecified injury of neck, initial encounter: Secondary | ICD-10-CM | POA: Diagnosis not present

## 2015-07-11 DIAGNOSIS — Y998 Other external cause status: Secondary | ICD-10-CM | POA: Insufficient documentation

## 2015-07-11 DIAGNOSIS — Y9301 Activity, walking, marching and hiking: Secondary | ICD-10-CM | POA: Insufficient documentation

## 2015-07-11 DIAGNOSIS — Z79899 Other long term (current) drug therapy: Secondary | ICD-10-CM | POA: Diagnosis not present

## 2015-07-11 DIAGNOSIS — S0990XA Unspecified injury of head, initial encounter: Secondary | ICD-10-CM | POA: Diagnosis not present

## 2015-07-11 DIAGNOSIS — T148 Other injury of unspecified body region: Secondary | ICD-10-CM | POA: Diagnosis not present

## 2015-07-11 DIAGNOSIS — Z7951 Long term (current) use of inhaled steroids: Secondary | ICD-10-CM | POA: Insufficient documentation

## 2015-07-11 DIAGNOSIS — Z91040 Latex allergy status: Secondary | ICD-10-CM | POA: Insufficient documentation

## 2015-07-11 DIAGNOSIS — M25552 Pain in left hip: Secondary | ICD-10-CM | POA: Diagnosis not present

## 2015-07-11 DIAGNOSIS — Z87891 Personal history of nicotine dependence: Secondary | ICD-10-CM | POA: Insufficient documentation

## 2015-07-11 DIAGNOSIS — S0093XA Contusion of unspecified part of head, initial encounter: Secondary | ICD-10-CM | POA: Diagnosis not present

## 2015-07-11 DIAGNOSIS — M25551 Pain in right hip: Secondary | ICD-10-CM | POA: Diagnosis not present

## 2015-07-11 MED ORDER — TETANUS-DIPHTH-ACELL PERTUSSIS 5-2.5-18.5 LF-MCG/0.5 IM SUSP
0.5000 mL | Freq: Once | INTRAMUSCULAR | Status: AC
  Administered 2015-07-11: 0.5 mL via INTRAMUSCULAR
  Filled 2015-07-11: qty 0.5

## 2015-07-11 NOTE — ED Provider Notes (Signed)
CSN: [id number]     Arrival date & time 07/11/15  1921 History   First MD Initiated Contact with Patient 07/11/15 1939     Chief Complaint  Patient presents with  . Fall  . Facial Laceration     (Consider location/radiation/quality/duration/timing/severity/associated sxs/prior Treatment) HPI   Patient is a 74 year old male who had a mechanical fall today. Patient was walking up the front of his steps. He had a tiny lip on the step and tripped over and fell forward onto his face.  he had no loss consciousness. Patient has mild pain onhis face otherwise asymptomatic.   History reviewed. No pertinent past medical history. History reviewed. No pertinent past surgical history. History reviewed. No pertinent family history. Social History  Substance Use Topics  . Smoking status: Former Smoker    Quit date: 07/10/1996  . Smokeless tobacco: None  . Alcohol Use: No    Review of Systems  Constitutional: Negative for fever and activity change.  HENT: Negative for congestion.   Eyes: Negative for discharge and redness.  Respiratory: Negative for cough and shortness of breath.   Cardiovascular: Negative for chest pain.  Gastrointestinal: Negative for abdominal pain.  Genitourinary: Negative for dysuria and urgency.  Musculoskeletal: Negative for arthralgias.  Allergic/Immunologic: Negative for immunocompromised state.  Neurological: Negative for dizziness, seizures, speech difficulty and headaches.  Psychiatric/Behavioral: Negative for behavioral problems and agitation.  All other systems reviewed and are negative.     Allergies  Latex  Home Medications   Prior to Admission medications   Medication Sig Start Date End Date Taking? Authorizing Provider  acetaminophen (TYLENOL) 500 MG tablet Take 500 mg by mouth every 6 (six) hours as needed for mild pain.   Yes Historical Provider, MD  b complex vitamins tablet Take 1 tablet by mouth daily.   Yes Historical Provider, MD   Calcium-Magnesium-Vitamin D (CALCIUM 500 PO) Take 1 tablet by mouth daily.   Yes Historical Provider, MD  fluticasone (FLONASE) 50 MCG/ACT nasal spray Place 2 sprays into both nostrils daily. 05/04/15  Yes Historical Provider, MD  Multiple Vitamins-Minerals (MENS 50+ MULTI VITAMIN/MIN) TABS Take 1 tablet by mouth daily.   Yes Historical Provider, MD  tamsulosin (FLOMAX) 0.4 MG CAPS capsule Take 1 capsule by mouth daily. At bed time 05/13/15  Yes Historical Provider, MD  traMADol (ULTRAM) 50 MG tablet Take 2 tablets by mouth every 8 (eight) hours as needed. 06/21/15  Yes Historical Provider, MD  vitamin C (ASCORBIC ACID) 500 MG tablet Take 500 mg by mouth daily.   Yes Historical Provider, MD  VITAMIN D, CHOLECALCIFEROL, PO Take 1 tablet by mouth daily.   Yes Historical Provider, MD  VITAMIN E PO Take 1 capsule by mouth daily.   Yes Historical Provider, MD   BP 134/71 mmHg  Pulse 58  Temp(Src) 99 F (37.2 C) (Oral)  Resp 16  Ht 5\' 10"  (1.778 m)  Wt 187 lb 5 oz (84.964 kg)  BMI 26.88 kg/m2  SpO2 100% Physical Exam  Constitutional: He is oriented to person, place, and time. He appears well-nourished.  HENT:  Head: Normocephalic.  Mouth/Throat: Oropharynx is clear and moist.  Swelling to upper lip. No malocclusion. Abrasion over upper lip.  Eyes: Conjunctivae are normal.  Neck: No tracheal deviation present.  Cardiovascular: Normal rate.   Pulmonary/Chest: Effort normal. No stridor. No respiratory distress.  Abdominal: Soft. There is no tenderness. There is no guarding.  Musculoskeletal: Normal range of motion. He exhibits no edema.  No CT or  L-spine tenderness. Full range of motion of bilateral upper and lower extremities  Neurological: He is oriented to person, place, and time. No cranial nerve deficit.  Skin: Skin is warm and dry. No rash noted. He is not diaphoretic.  No evidence of trauma except to face.  Psychiatric: He has a normal mood and affect.  Nursing note and vitals  reviewed.   ED Course  Procedures (including critical care time) Labs Review Labs Reviewed - No data to display  Imaging Review Dg Pelvis 1-2 Views  07/11/2015  CLINICAL DATA:  Tripped and fell on porch steps today with bilateral hip pain. EXAM: PELVIS - 1-2 VIEW COMPARISON:  05/14/2014 FINDINGS: Exam demonstrates mild symmetric degenerative change of the hips. There is no definite fracture or dislocation. There are degenerative changes of the spine. IMPRESSION: No acute findings. Minimal symmetric degenerate change of the hips. Electronically Signed   By: Elberta Fortis M.D.   On: 07/11/2015 21:05   Ct Head Wo Contrast  07/11/2015  CLINICAL DATA:  74 year old male with fall and trauma to the top of head. EXAM: CT HEAD WITHOUT CONTRAST CT MAXILLOFACIAL WITHOUT CONTRAST CT CERVICAL SPINE WITHOUT CONTRAST TECHNIQUE: Multidetector CT imaging of the head, cervical spine, and maxillofacial structures were performed using the standard protocol without intravenous contrast. Multiplanar CT image reconstructions of the cervical spine and maxillofacial structures were also generated. COMPARISON:  None. FINDINGS: CT HEAD FINDINGS There is slight prominence of the ventricles and sulci compatible with age-related volume loss. Mild periventricular and deep white matter hypodensities represent chronic microvascular ischemic changes. There is no intracranial hemorrhage. No mass effect or midline shift identified. The visualized paranasal sinuses and mastoid air cells are well aerated. The calvarium is intact. CT MAXILLOFACIAL FINDINGS There is no acute fracture. The maxilla, mandible, and pterygoid plates are intact. The globes, retro-orbital fat, and orbital walls are preserved. The visualized paranasal sinuses and mastoid air cells are well aerated. There is soft tissue swelling of the upper lip. CT CERVICAL SPINE FINDINGS There is no acute fracture or subluxation of the cervical spine.There multilevel degenerative  changes most prominent at C3-C7 where there facet hypertrophy, endplate irregularity and disc space narrowing. There is mild reversal of normal cervical lordosis. There is bilateral neural foramina narrowing at C3-C4, C4-C5, C5-C6. The odontoid and spinous processes are intact.There is normal anatomic alignment of the C1-C2 lateral masses. The visualized soft tissues appear unremarkable. Multiple bilateral small hypodense thyroid nodules measuring up to 7 mm on the left. IMPRESSION: No acute intracranial hemorrhage. Mild age-related atrophy and chronic microvascular ischemic disease. No acute/traumatic cervical spine pathology. No acute facial bone fractures. Electronically Signed   By: Elgie Collard M.D.   On: 07/11/2015 20:55   Ct Cervical Spine Wo Contrast  07/11/2015  CLINICAL DATA:  73 year old male with fall and trauma to the top of head. EXAM: CT HEAD WITHOUT CONTRAST CT MAXILLOFACIAL WITHOUT CONTRAST CT CERVICAL SPINE WITHOUT CONTRAST TECHNIQUE: Multidetector CT imaging of the head, cervical spine, and maxillofacial structures were performed using the standard protocol without intravenous contrast. Multiplanar CT image reconstructions of the cervical spine and maxillofacial structures were also generated. COMPARISON:  None. FINDINGS: CT HEAD FINDINGS There is slight prominence of the ventricles and sulci compatible with age-related volume loss. Mild periventricular and deep white matter hypodensities represent chronic microvascular ischemic changes. There is no intracranial hemorrhage. No mass effect or midline shift identified. The visualized paranasal sinuses and mastoid air cells are well aerated. The calvarium is intact. CT MAXILLOFACIAL  FINDINGS There is no acute fracture. The maxilla, mandible, and pterygoid plates are intact. The globes, retro-orbital fat, and orbital walls are preserved. The visualized paranasal sinuses and mastoid air cells are well aerated. There is soft tissue swelling of  the upper lip. CT CERVICAL SPINE FINDINGS There is no acute fracture or subluxation of the cervical spine.There multilevel degenerative changes most prominent at C3-C7 where there facet hypertrophy, endplate irregularity and disc space narrowing. There is mild reversal of normal cervical lordosis. There is bilateral neural foramina narrowing at C3-C4, C4-C5, C5-C6. The odontoid and spinous processes are intact.There is normal anatomic alignment of the C1-C2 lateral masses. The visualized soft tissues appear unremarkable. Multiple bilateral small hypodense thyroid nodules measuring up to 7 mm on the left. IMPRESSION: No acute intracranial hemorrhage. Mild age-related atrophy and chronic microvascular ischemic disease. No acute/traumatic cervical spine pathology. No acute facial bone fractures. Electronically Signed   By: Elgie Collard M.D.   On: 07/11/2015 20:55   Ct Maxillofacial Wo Cm  07/11/2015  CLINICAL DATA:  74 year old male with fall and trauma to the top of head. EXAM: CT HEAD WITHOUT CONTRAST CT MAXILLOFACIAL WITHOUT CONTRAST CT CERVICAL SPINE WITHOUT CONTRAST TECHNIQUE: Multidetector CT imaging of the head, cervical spine, and maxillofacial structures were performed using the standard protocol without intravenous contrast. Multiplanar CT image reconstructions of the cervical spine and maxillofacial structures were also generated. COMPARISON:  None. FINDINGS: CT HEAD FINDINGS There is slight prominence of the ventricles and sulci compatible with age-related volume loss. Mild periventricular and deep white matter hypodensities represent chronic microvascular ischemic changes. There is no intracranial hemorrhage. No mass effect or midline shift identified. The visualized paranasal sinuses and mastoid air cells are well aerated. The calvarium is intact. CT MAXILLOFACIAL FINDINGS There is no acute fracture. The maxilla, mandible, and pterygoid plates are intact. The globes, retro-orbital fat, and orbital  walls are preserved. The visualized paranasal sinuses and mastoid air cells are well aerated. There is soft tissue swelling of the upper lip. CT CERVICAL SPINE FINDINGS There is no acute fracture or subluxation of the cervical spine.There multilevel degenerative changes most prominent at C3-C7 where there facet hypertrophy, endplate irregularity and disc space narrowing. There is mild reversal of normal cervical lordosis. There is bilateral neural foramina narrowing at C3-C4, C4-C5, C5-C6. The odontoid and spinous processes are intact.There is normal anatomic alignment of the C1-C2 lateral masses. The visualized soft tissues appear unremarkable. Multiple bilateral small hypodense thyroid nodules measuring up to 7 mm on the left. IMPRESSION: No acute intracranial hemorrhage. Mild age-related atrophy and chronic microvascular ischemic disease. No acute/traumatic cervical spine pathology. No acute facial bone fractures. Electronically Signed   By: Elgie Collard M.D.   On: 07/11/2015 20:55   I have personally reviewed and evaluated these images and lab results as part of my medical decision-making.   EKG Interpretation None      MDM   Final diagnoses:  None  Patient is a 74 year old male presenting after mechanical fall. Patient sustained abrasion to his upper lip. We'll get CT head and face and neck. Other than that patient has no tenderness anywhere. We will give him an updated T dap. Make sure it is ambulatory  and likely be able to discharge home.  10:51 PM CTs normal.  Lac repaired. Patient rfeels in his usual state of health.  Plan to discharge.     LACERATION REPAIR Performed by: Arlana Hove Authorized by: Arlana Hove Consent: Verbal consent obtained. Risks and  benefits: risks, benefits and alternatives were discussed Consent given by: patient Patient identity confirmed: provided demographic data Prepped and Draped in normal sterile fashion Wound  explored  Laceration Location: upper lip  Laceration Length: 1 cm  No Foreign Bodies seen or palpated   Irrigation method: syringe+ peroxide Amount of cleaning: standard  Skin closure: dermabond   Patient tolerance: Patient tolerated the procedure well with no immediate complications.   Courteney Randall An, MD 07/11/15 2251

## 2015-07-11 NOTE — ED Notes (Signed)
Per EMS: Pt walking up stairs on front porch, tripped on stairs and fell forward. Struck face on steps. Denies any LOC or head trauma. Complaining of bilateral hand pain from bracing fall.

## 2015-07-12 DIAGNOSIS — M25559 Pain in unspecified hip: Secondary | ICD-10-CM | POA: Diagnosis not present

## 2015-07-24 DIAGNOSIS — N183 Chronic kidney disease, stage 3 (moderate): Secondary | ICD-10-CM | POA: Diagnosis not present

## 2015-07-24 DIAGNOSIS — M5030 Other cervical disc degeneration, unspecified cervical region: Secondary | ICD-10-CM | POA: Diagnosis not present

## 2015-07-24 DIAGNOSIS — M159 Polyosteoarthritis, unspecified: Secondary | ICD-10-CM | POA: Diagnosis not present

## 2015-07-24 DIAGNOSIS — M5136 Other intervertebral disc degeneration, lumbar region: Secondary | ICD-10-CM | POA: Diagnosis not present

## 2015-09-16 DIAGNOSIS — M5136 Other intervertebral disc degeneration, lumbar region: Secondary | ICD-10-CM | POA: Diagnosis not present

## 2015-09-16 DIAGNOSIS — M5030 Other cervical disc degeneration, unspecified cervical region: Secondary | ICD-10-CM | POA: Diagnosis not present

## 2015-09-16 DIAGNOSIS — R5383 Other fatigue: Secondary | ICD-10-CM | POA: Diagnosis not present

## 2015-10-07 DIAGNOSIS — M159 Polyosteoarthritis, unspecified: Secondary | ICD-10-CM | POA: Diagnosis not present

## 2015-10-07 DIAGNOSIS — E039 Hypothyroidism, unspecified: Secondary | ICD-10-CM | POA: Diagnosis not present

## 2015-10-07 DIAGNOSIS — M5136 Other intervertebral disc degeneration, lumbar region: Secondary | ICD-10-CM | POA: Diagnosis not present

## 2015-10-07 DIAGNOSIS — G8929 Other chronic pain: Secondary | ICD-10-CM | POA: Diagnosis not present

## 2015-10-07 DIAGNOSIS — M5030 Other cervical disc degeneration, unspecified cervical region: Secondary | ICD-10-CM | POA: Diagnosis not present

## 2015-10-17 DIAGNOSIS — M25531 Pain in right wrist: Secondary | ICD-10-CM | POA: Diagnosis not present

## 2015-10-17 DIAGNOSIS — M25532 Pain in left wrist: Secondary | ICD-10-CM | POA: Diagnosis not present

## 2015-11-18 DIAGNOSIS — E039 Hypothyroidism, unspecified: Secondary | ICD-10-CM | POA: Diagnosis not present

## 2015-11-19 DIAGNOSIS — M545 Low back pain: Secondary | ICD-10-CM | POA: Diagnosis not present

## 2015-11-19 DIAGNOSIS — M79641 Pain in right hand: Secondary | ICD-10-CM | POA: Diagnosis not present

## 2015-11-19 DIAGNOSIS — M064 Inflammatory polyarthropathy: Secondary | ICD-10-CM | POA: Diagnosis not present

## 2015-11-19 DIAGNOSIS — L409 Psoriasis, unspecified: Secondary | ICD-10-CM | POA: Diagnosis not present

## 2015-11-19 DIAGNOSIS — M2550 Pain in unspecified joint: Secondary | ICD-10-CM | POA: Diagnosis not present

## 2015-11-19 DIAGNOSIS — M79642 Pain in left hand: Secondary | ICD-10-CM | POA: Diagnosis not present

## 2015-11-19 DIAGNOSIS — R5383 Other fatigue: Secondary | ICD-10-CM | POA: Diagnosis not present

## 2015-12-03 DIAGNOSIS — L4050 Arthropathic psoriasis, unspecified: Secondary | ICD-10-CM | POA: Diagnosis not present

## 2015-12-03 DIAGNOSIS — A421 Abdominal actinomycosis: Secondary | ICD-10-CM | POA: Diagnosis not present

## 2015-12-03 DIAGNOSIS — M2550 Pain in unspecified joint: Secondary | ICD-10-CM | POA: Diagnosis not present

## 2015-12-03 DIAGNOSIS — L409 Psoriasis, unspecified: Secondary | ICD-10-CM | POA: Diagnosis not present

## 2015-12-03 DIAGNOSIS — M545 Low back pain: Secondary | ICD-10-CM | POA: Diagnosis not present

## 2015-12-03 DIAGNOSIS — M150 Primary generalized (osteo)arthritis: Secondary | ICD-10-CM | POA: Diagnosis not present

## 2015-12-03 DIAGNOSIS — Z79899 Other long term (current) drug therapy: Secondary | ICD-10-CM | POA: Diagnosis not present

## 2016-01-20 DIAGNOSIS — E039 Hypothyroidism, unspecified: Secondary | ICD-10-CM | POA: Diagnosis not present

## 2016-01-30 DIAGNOSIS — L4050 Arthropathic psoriasis, unspecified: Secondary | ICD-10-CM | POA: Diagnosis not present

## 2016-01-30 DIAGNOSIS — Z79899 Other long term (current) drug therapy: Secondary | ICD-10-CM | POA: Diagnosis not present

## 2016-01-30 DIAGNOSIS — M2550 Pain in unspecified joint: Secondary | ICD-10-CM | POA: Diagnosis not present

## 2016-01-30 DIAGNOSIS — M545 Low back pain: Secondary | ICD-10-CM | POA: Diagnosis not present

## 2016-01-30 DIAGNOSIS — L409 Psoriasis, unspecified: Secondary | ICD-10-CM | POA: Diagnosis not present

## 2016-01-30 DIAGNOSIS — M150 Primary generalized (osteo)arthritis: Secondary | ICD-10-CM | POA: Diagnosis not present

## 2016-02-07 DIAGNOSIS — M159 Polyosteoarthritis, unspecified: Secondary | ICD-10-CM | POA: Diagnosis not present

## 2016-02-07 DIAGNOSIS — S81811A Laceration without foreign body, right lower leg, initial encounter: Secondary | ICD-10-CM | POA: Diagnosis not present

## 2016-02-07 DIAGNOSIS — L0390 Cellulitis, unspecified: Secondary | ICD-10-CM | POA: Diagnosis not present

## 2016-02-12 DIAGNOSIS — S81811D Laceration without foreign body, right lower leg, subsequent encounter: Secondary | ICD-10-CM | POA: Diagnosis not present

## 2016-02-12 DIAGNOSIS — L0390 Cellulitis, unspecified: Secondary | ICD-10-CM | POA: Diagnosis not present

## 2016-02-19 DIAGNOSIS — L0390 Cellulitis, unspecified: Secondary | ICD-10-CM | POA: Diagnosis not present

## 2016-02-19 DIAGNOSIS — S81811D Laceration without foreign body, right lower leg, subsequent encounter: Secondary | ICD-10-CM | POA: Diagnosis not present

## 2016-04-01 DIAGNOSIS — Z79899 Other long term (current) drug therapy: Secondary | ICD-10-CM | POA: Diagnosis not present

## 2016-04-01 DIAGNOSIS — M2550 Pain in unspecified joint: Secondary | ICD-10-CM | POA: Diagnosis not present

## 2016-04-01 DIAGNOSIS — M150 Primary generalized (osteo)arthritis: Secondary | ICD-10-CM | POA: Diagnosis not present

## 2016-04-01 DIAGNOSIS — L409 Psoriasis, unspecified: Secondary | ICD-10-CM | POA: Diagnosis not present

## 2016-04-01 DIAGNOSIS — L4050 Arthropathic psoriasis, unspecified: Secondary | ICD-10-CM | POA: Diagnosis not present

## 2016-05-01 DIAGNOSIS — L4050 Arthropathic psoriasis, unspecified: Secondary | ICD-10-CM | POA: Diagnosis not present

## 2016-05-05 DIAGNOSIS — N183 Chronic kidney disease, stage 3 (moderate): Secondary | ICD-10-CM | POA: Diagnosis not present

## 2016-05-05 DIAGNOSIS — M1990 Unspecified osteoarthritis, unspecified site: Secondary | ICD-10-CM | POA: Diagnosis not present

## 2016-05-05 DIAGNOSIS — L4050 Arthropathic psoriasis, unspecified: Secondary | ICD-10-CM | POA: Diagnosis not present

## 2016-05-05 DIAGNOSIS — M25511 Pain in right shoulder: Secondary | ICD-10-CM | POA: Diagnosis not present

## 2016-05-22 DIAGNOSIS — M12811 Other specific arthropathies, not elsewhere classified, right shoulder: Secondary | ICD-10-CM | POA: Diagnosis not present

## 2016-06-02 DIAGNOSIS — M12811 Other specific arthropathies, not elsewhere classified, right shoulder: Secondary | ICD-10-CM | POA: Diagnosis not present

## 2016-06-12 DIAGNOSIS — R52 Pain, unspecified: Secondary | ICD-10-CM | POA: Diagnosis not present

## 2016-06-12 DIAGNOSIS — H109 Unspecified conjunctivitis: Secondary | ICD-10-CM | POA: Diagnosis not present

## 2016-06-12 DIAGNOSIS — J069 Acute upper respiratory infection, unspecified: Secondary | ICD-10-CM | POA: Diagnosis not present

## 2016-07-05 IMAGING — DX DG PELVIS 1-2V
1 series · 1 of 1 positions shown · non-contrast
Comparison: 05/14/2014

CLINICAL DATA: Tripped and fell on porch steps today with bilateral
hip pain.

EXAM:
PELVIS - 1-2 VIEW

[pelvis ap]
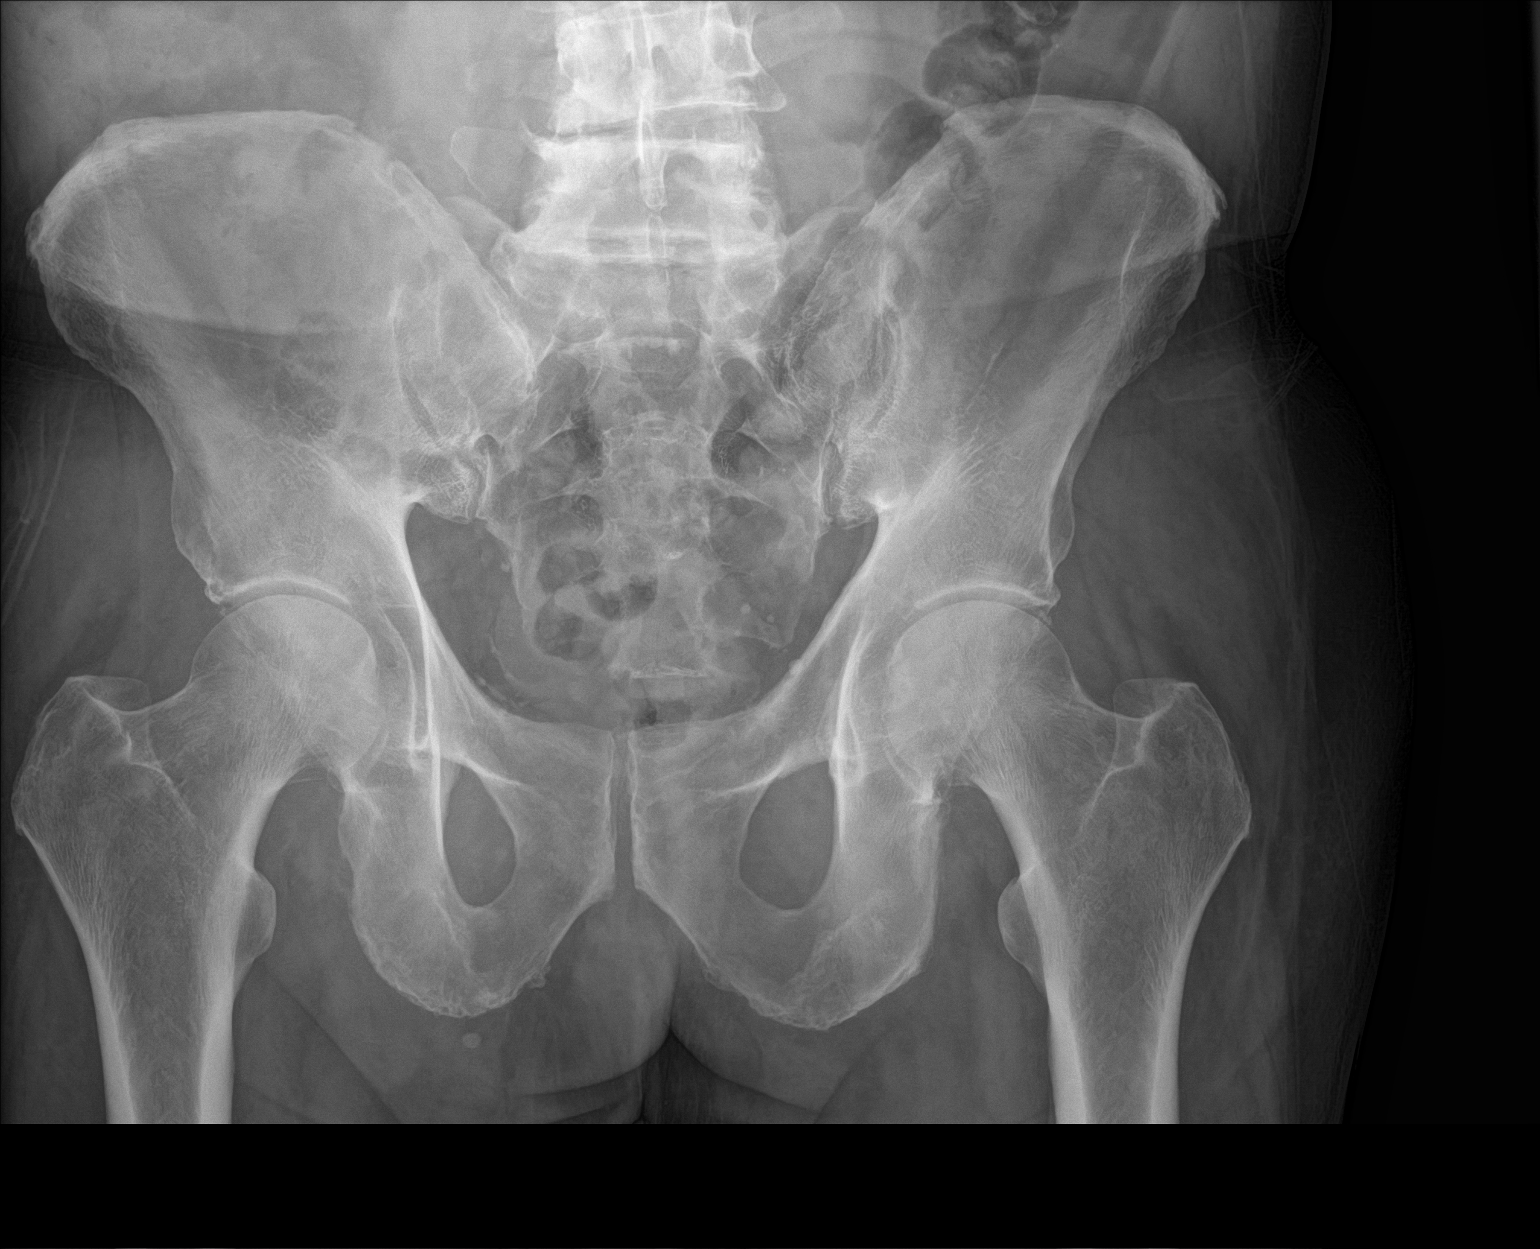

[1 of 1 positions shown; findings below may reference images not displayed]

FINDINGS: Exam demonstrates mild symmetric degenerative change of the hips.
There is no definite fracture or dislocation. There are degenerative
changes of the spine.
IMPRESSION: No acute findings.

Minimal symmetric degenerate change of the hips.

## 2016-07-08 DIAGNOSIS — M19011 Primary osteoarthritis, right shoulder: Secondary | ICD-10-CM | POA: Diagnosis not present

## 2016-07-16 DIAGNOSIS — L409 Psoriasis, unspecified: Secondary | ICD-10-CM | POA: Diagnosis not present

## 2016-07-16 DIAGNOSIS — M150 Primary generalized (osteo)arthritis: Secondary | ICD-10-CM | POA: Diagnosis not present

## 2016-07-16 DIAGNOSIS — Z79899 Other long term (current) drug therapy: Secondary | ICD-10-CM | POA: Diagnosis not present

## 2016-07-16 DIAGNOSIS — L4050 Arthropathic psoriasis, unspecified: Secondary | ICD-10-CM | POA: Diagnosis not present

## 2016-07-16 DIAGNOSIS — M2550 Pain in unspecified joint: Secondary | ICD-10-CM | POA: Diagnosis not present

## 2016-07-20 DIAGNOSIS — N400 Enlarged prostate without lower urinary tract symptoms: Secondary | ICD-10-CM | POA: Diagnosis not present

## 2016-07-22 DIAGNOSIS — R454 Irritability and anger: Secondary | ICD-10-CM | POA: Diagnosis not present

## 2016-07-22 DIAGNOSIS — G8929 Other chronic pain: Secondary | ICD-10-CM | POA: Diagnosis not present

## 2016-07-22 DIAGNOSIS — M5030 Other cervical disc degeneration, unspecified cervical region: Secondary | ICD-10-CM | POA: Diagnosis not present

## 2016-07-22 DIAGNOSIS — E039 Hypothyroidism, unspecified: Secondary | ICD-10-CM | POA: Diagnosis not present

## 2016-07-22 DIAGNOSIS — M5136 Other intervertebral disc degeneration, lumbar region: Secondary | ICD-10-CM | POA: Diagnosis not present

## 2016-08-17 DIAGNOSIS — J209 Acute bronchitis, unspecified: Secondary | ICD-10-CM | POA: Diagnosis not present

## 2016-08-20 DIAGNOSIS — J189 Pneumonia, unspecified organism: Secondary | ICD-10-CM | POA: Diagnosis not present

## 2016-09-14 DIAGNOSIS — J309 Allergic rhinitis, unspecified: Secondary | ICD-10-CM | POA: Diagnosis not present

## 2016-09-14 DIAGNOSIS — R454 Irritability and anger: Secondary | ICD-10-CM | POA: Diagnosis not present

## 2016-10-06 DIAGNOSIS — R351 Nocturia: Secondary | ICD-10-CM | POA: Diagnosis not present

## 2016-10-06 DIAGNOSIS — R3915 Urgency of urination: Secondary | ICD-10-CM | POA: Diagnosis not present

## 2016-10-06 DIAGNOSIS — N401 Enlarged prostate with lower urinary tract symptoms: Secondary | ICD-10-CM | POA: Diagnosis not present

## 2016-10-06 DIAGNOSIS — H524 Presbyopia: Secondary | ICD-10-CM | POA: Diagnosis not present

## 2016-10-14 DIAGNOSIS — Z0000 Encounter for general adult medical examination without abnormal findings: Secondary | ICD-10-CM | POA: Diagnosis not present

## 2016-10-14 DIAGNOSIS — L409 Psoriasis, unspecified: Secondary | ICD-10-CM | POA: Diagnosis not present

## 2016-10-14 DIAGNOSIS — Z131 Encounter for screening for diabetes mellitus: Secondary | ICD-10-CM | POA: Diagnosis not present

## 2016-10-14 DIAGNOSIS — R739 Hyperglycemia, unspecified: Secondary | ICD-10-CM | POA: Diagnosis not present

## 2016-10-14 DIAGNOSIS — M2550 Pain in unspecified joint: Secondary | ICD-10-CM | POA: Diagnosis not present

## 2016-10-14 DIAGNOSIS — N183 Chronic kidney disease, stage 3 (moderate): Secondary | ICD-10-CM | POA: Diagnosis not present

## 2016-10-14 DIAGNOSIS — E663 Overweight: Secondary | ICD-10-CM | POA: Diagnosis not present

## 2016-10-14 DIAGNOSIS — Z13220 Encounter for screening for lipoid disorders: Secondary | ICD-10-CM | POA: Diagnosis not present

## 2016-10-14 DIAGNOSIS — L4050 Arthropathic psoriasis, unspecified: Secondary | ICD-10-CM | POA: Diagnosis not present

## 2016-10-14 DIAGNOSIS — Z283 Underimmunization status: Secondary | ICD-10-CM | POA: Diagnosis not present

## 2016-10-14 DIAGNOSIS — Z79899 Other long term (current) drug therapy: Secondary | ICD-10-CM | POA: Diagnosis not present

## 2016-10-14 DIAGNOSIS — G8929 Other chronic pain: Secondary | ICD-10-CM | POA: Diagnosis not present

## 2016-10-14 DIAGNOSIS — M150 Primary generalized (osteo)arthritis: Secondary | ICD-10-CM | POA: Diagnosis not present

## 2016-10-14 DIAGNOSIS — E039 Hypothyroidism, unspecified: Secondary | ICD-10-CM | POA: Diagnosis not present

## 2016-10-14 DIAGNOSIS — Z6826 Body mass index (BMI) 26.0-26.9, adult: Secondary | ICD-10-CM | POA: Diagnosis not present

## 2016-10-14 DIAGNOSIS — Z130 Encounter for screening for diseases of the blood and blood-forming organs and certain disorders involving the immune mechanism: Secondary | ICD-10-CM | POA: Diagnosis not present

## 2016-11-09 DIAGNOSIS — M159 Polyosteoarthritis, unspecified: Secondary | ICD-10-CM | POA: Diagnosis not present

## 2016-11-09 DIAGNOSIS — G8929 Other chronic pain: Secondary | ICD-10-CM | POA: Diagnosis not present

## 2016-11-09 DIAGNOSIS — N183 Chronic kidney disease, stage 3 (moderate): Secondary | ICD-10-CM | POA: Diagnosis not present

## 2016-11-09 DIAGNOSIS — R454 Irritability and anger: Secondary | ICD-10-CM | POA: Diagnosis not present

## 2016-11-09 DIAGNOSIS — L4050 Arthropathic psoriasis, unspecified: Secondary | ICD-10-CM | POA: Diagnosis not present

## 2016-11-09 DIAGNOSIS — R8290 Unspecified abnormal findings in urine: Secondary | ICD-10-CM | POA: Diagnosis not present

## 2016-11-11 DIAGNOSIS — L4050 Arthropathic psoriasis, unspecified: Secondary | ICD-10-CM | POA: Diagnosis not present

## 2016-12-16 DIAGNOSIS — L409 Psoriasis, unspecified: Secondary | ICD-10-CM | POA: Diagnosis not present

## 2016-12-16 DIAGNOSIS — L4050 Arthropathic psoriasis, unspecified: Secondary | ICD-10-CM | POA: Diagnosis not present

## 2016-12-16 DIAGNOSIS — M2550 Pain in unspecified joint: Secondary | ICD-10-CM | POA: Diagnosis not present

## 2016-12-16 DIAGNOSIS — M150 Primary generalized (osteo)arthritis: Secondary | ICD-10-CM | POA: Diagnosis not present

## 2016-12-16 DIAGNOSIS — Z79899 Other long term (current) drug therapy: Secondary | ICD-10-CM | POA: Diagnosis not present

## 2016-12-16 DIAGNOSIS — E663 Overweight: Secondary | ICD-10-CM | POA: Diagnosis not present

## 2016-12-16 DIAGNOSIS — Z6827 Body mass index (BMI) 27.0-27.9, adult: Secondary | ICD-10-CM | POA: Diagnosis not present

## 2016-12-28 DIAGNOSIS — R454 Irritability and anger: Secondary | ICD-10-CM | POA: Diagnosis not present

## 2016-12-28 DIAGNOSIS — L4050 Arthropathic psoriasis, unspecified: Secondary | ICD-10-CM | POA: Diagnosis not present

## 2016-12-28 DIAGNOSIS — M159 Polyosteoarthritis, unspecified: Secondary | ICD-10-CM | POA: Diagnosis not present

## 2016-12-28 DIAGNOSIS — M26623 Arthralgia of bilateral temporomandibular joint: Secondary | ICD-10-CM | POA: Diagnosis not present

## 2016-12-28 DIAGNOSIS — G8929 Other chronic pain: Secondary | ICD-10-CM | POA: Diagnosis not present

## 2016-12-28 DIAGNOSIS — M5136 Other intervertebral disc degeneration, lumbar region: Secondary | ICD-10-CM | POA: Diagnosis not present

## 2017-01-18 DIAGNOSIS — R351 Nocturia: Secondary | ICD-10-CM | POA: Diagnosis not present

## 2017-01-18 DIAGNOSIS — N401 Enlarged prostate with lower urinary tract symptoms: Secondary | ICD-10-CM | POA: Diagnosis not present

## 2017-01-18 DIAGNOSIS — N3941 Urge incontinence: Secondary | ICD-10-CM | POA: Diagnosis not present

## 2017-01-20 DIAGNOSIS — L4050 Arthropathic psoriasis, unspecified: Secondary | ICD-10-CM | POA: Diagnosis not present

## 2017-01-26 DIAGNOSIS — R454 Irritability and anger: Secondary | ICD-10-CM | POA: Diagnosis not present

## 2017-01-26 DIAGNOSIS — G8929 Other chronic pain: Secondary | ICD-10-CM | POA: Diagnosis not present

## 2017-01-26 DIAGNOSIS — E039 Hypothyroidism, unspecified: Secondary | ICD-10-CM | POA: Diagnosis not present

## 2017-02-16 DIAGNOSIS — N3941 Urge incontinence: Secondary | ICD-10-CM | POA: Diagnosis not present

## 2017-02-16 DIAGNOSIS — R3915 Urgency of urination: Secondary | ICD-10-CM | POA: Diagnosis not present

## 2017-02-23 DIAGNOSIS — B351 Tinea unguium: Secondary | ICD-10-CM | POA: Diagnosis not present

## 2017-02-23 DIAGNOSIS — S91209A Unspecified open wound of unspecified toe(s) with damage to nail, initial encounter: Secondary | ICD-10-CM | POA: Diagnosis not present

## 2017-02-23 DIAGNOSIS — R454 Irritability and anger: Secondary | ICD-10-CM | POA: Diagnosis not present

## 2017-02-23 DIAGNOSIS — M5136 Other intervertebral disc degeneration, lumbar region: Secondary | ICD-10-CM | POA: Diagnosis not present

## 2017-02-23 DIAGNOSIS — M159 Polyosteoarthritis, unspecified: Secondary | ICD-10-CM | POA: Diagnosis not present

## 2017-03-01 DIAGNOSIS — S91209D Unspecified open wound of unspecified toe(s) with damage to nail, subsequent encounter: Secondary | ICD-10-CM | POA: Diagnosis not present

## 2017-03-01 DIAGNOSIS — B351 Tinea unguium: Secondary | ICD-10-CM | POA: Diagnosis not present

## 2017-03-08 DIAGNOSIS — R0789 Other chest pain: Secondary | ICD-10-CM | POA: Diagnosis not present

## 2017-03-08 DIAGNOSIS — M5136 Other intervertebral disc degeneration, lumbar region: Secondary | ICD-10-CM | POA: Diagnosis not present

## 2017-03-08 DIAGNOSIS — E039 Hypothyroidism, unspecified: Secondary | ICD-10-CM | POA: Diagnosis not present

## 2017-03-08 DIAGNOSIS — R6889 Other general symptoms and signs: Secondary | ICD-10-CM | POA: Diagnosis not present

## 2017-03-08 DIAGNOSIS — G894 Chronic pain syndrome: Secondary | ICD-10-CM | POA: Diagnosis not present

## 2017-03-08 DIAGNOSIS — R454 Irritability and anger: Secondary | ICD-10-CM | POA: Diagnosis not present

## 2017-03-09 DIAGNOSIS — M25571 Pain in right ankle and joints of right foot: Secondary | ICD-10-CM | POA: Diagnosis not present

## 2017-03-09 DIAGNOSIS — M19071 Primary osteoarthritis, right ankle and foot: Secondary | ICD-10-CM | POA: Diagnosis not present

## 2017-03-09 DIAGNOSIS — M25572 Pain in left ankle and joints of left foot: Secondary | ICD-10-CM | POA: Diagnosis not present

## 2017-03-09 DIAGNOSIS — M19072 Primary osteoarthritis, left ankle and foot: Secondary | ICD-10-CM | POA: Diagnosis not present

## 2017-03-12 DIAGNOSIS — R454 Irritability and anger: Secondary | ICD-10-CM | POA: Diagnosis not present

## 2017-03-17 DIAGNOSIS — M2550 Pain in unspecified joint: Secondary | ICD-10-CM | POA: Diagnosis not present

## 2017-03-17 DIAGNOSIS — Z79899 Other long term (current) drug therapy: Secondary | ICD-10-CM | POA: Diagnosis not present

## 2017-03-17 DIAGNOSIS — M064 Inflammatory polyarthropathy: Secondary | ICD-10-CM | POA: Diagnosis not present

## 2017-03-17 DIAGNOSIS — L409 Psoriasis, unspecified: Secondary | ICD-10-CM | POA: Diagnosis not present

## 2017-03-17 DIAGNOSIS — Z6826 Body mass index (BMI) 26.0-26.9, adult: Secondary | ICD-10-CM | POA: Diagnosis not present

## 2017-03-17 DIAGNOSIS — L4050 Arthropathic psoriasis, unspecified: Secondary | ICD-10-CM | POA: Diagnosis not present

## 2017-03-17 DIAGNOSIS — M150 Primary generalized (osteo)arthritis: Secondary | ICD-10-CM | POA: Diagnosis not present

## 2017-03-22 DIAGNOSIS — M19072 Primary osteoarthritis, left ankle and foot: Secondary | ICD-10-CM | POA: Diagnosis not present

## 2017-03-24 DIAGNOSIS — R454 Irritability and anger: Secondary | ICD-10-CM | POA: Diagnosis not present

## 2017-03-24 DIAGNOSIS — Z23 Encounter for immunization: Secondary | ICD-10-CM | POA: Diagnosis not present

## 2017-05-17 DIAGNOSIS — L03031 Cellulitis of right toe: Secondary | ICD-10-CM | POA: Diagnosis not present

## 2017-05-17 DIAGNOSIS — R454 Irritability and anger: Secondary | ICD-10-CM | POA: Diagnosis not present

## 2017-05-19 DIAGNOSIS — B351 Tinea unguium: Secondary | ICD-10-CM | POA: Diagnosis not present

## 2017-05-19 DIAGNOSIS — L600 Ingrowing nail: Secondary | ICD-10-CM | POA: Diagnosis not present

## 2017-05-31 DIAGNOSIS — M1990 Unspecified osteoarthritis, unspecified site: Secondary | ICD-10-CM | POA: Diagnosis not present

## 2017-05-31 DIAGNOSIS — L4050 Arthropathic psoriasis, unspecified: Secondary | ICD-10-CM | POA: Diagnosis not present

## 2017-05-31 DIAGNOSIS — M25551 Pain in right hip: Secondary | ICD-10-CM | POA: Diagnosis not present

## 2017-06-16 DIAGNOSIS — M2550 Pain in unspecified joint: Secondary | ICD-10-CM | POA: Diagnosis not present

## 2017-06-16 DIAGNOSIS — M150 Primary generalized (osteo)arthritis: Secondary | ICD-10-CM | POA: Diagnosis not present

## 2017-06-16 DIAGNOSIS — L409 Psoriasis, unspecified: Secondary | ICD-10-CM | POA: Diagnosis not present

## 2017-06-16 DIAGNOSIS — Z79899 Other long term (current) drug therapy: Secondary | ICD-10-CM | POA: Diagnosis not present

## 2017-06-16 DIAGNOSIS — L4050 Arthropathic psoriasis, unspecified: Secondary | ICD-10-CM | POA: Diagnosis not present

## 2017-06-16 DIAGNOSIS — E663 Overweight: Secondary | ICD-10-CM | POA: Diagnosis not present

## 2017-06-16 DIAGNOSIS — Z6827 Body mass index (BMI) 27.0-27.9, adult: Secondary | ICD-10-CM | POA: Diagnosis not present

## 2017-06-23 DIAGNOSIS — M5136 Other intervertebral disc degeneration, lumbar region: Secondary | ICD-10-CM | POA: Diagnosis not present

## 2017-06-23 DIAGNOSIS — R5383 Other fatigue: Secondary | ICD-10-CM | POA: Diagnosis not present

## 2017-06-23 DIAGNOSIS — G4733 Obstructive sleep apnea (adult) (pediatric): Secondary | ICD-10-CM | POA: Diagnosis not present

## 2017-06-23 DIAGNOSIS — G8929 Other chronic pain: Secondary | ICD-10-CM | POA: Diagnosis not present

## 2017-06-23 DIAGNOSIS — M159 Polyosteoarthritis, unspecified: Secondary | ICD-10-CM | POA: Diagnosis not present

## 2017-06-23 DIAGNOSIS — L4050 Arthropathic psoriasis, unspecified: Secondary | ICD-10-CM | POA: Diagnosis not present

## 2017-06-23 DIAGNOSIS — Z9989 Dependence on other enabling machines and devices: Secondary | ICD-10-CM | POA: Diagnosis not present

## 2017-11-11 DIAGNOSIS — E039 Hypothyroidism, unspecified: Secondary | ICD-10-CM | POA: Diagnosis not present

## 2017-11-11 DIAGNOSIS — J069 Acute upper respiratory infection, unspecified: Secondary | ICD-10-CM | POA: Diagnosis not present

## 2017-11-11 DIAGNOSIS — Z0000 Encounter for general adult medical examination without abnormal findings: Secondary | ICD-10-CM | POA: Diagnosis not present

## 2017-11-11 DIAGNOSIS — G8929 Other chronic pain: Secondary | ICD-10-CM | POA: Diagnosis not present

## 2017-11-11 DIAGNOSIS — N183 Chronic kidney disease, stage 3 (moderate): Secondary | ICD-10-CM | POA: Diagnosis not present

## 2017-12-13 DIAGNOSIS — J209 Acute bronchitis, unspecified: Secondary | ICD-10-CM | POA: Diagnosis not present

## 2017-12-13 DIAGNOSIS — L4050 Arthropathic psoriasis, unspecified: Secondary | ICD-10-CM | POA: Diagnosis not present

## 2017-12-13 DIAGNOSIS — M25512 Pain in left shoulder: Secondary | ICD-10-CM | POA: Diagnosis not present

## 2017-12-13 DIAGNOSIS — M159 Polyosteoarthritis, unspecified: Secondary | ICD-10-CM | POA: Diagnosis not present

## 2017-12-15 DIAGNOSIS — R5383 Other fatigue: Secondary | ICD-10-CM | POA: Diagnosis not present

## 2017-12-15 DIAGNOSIS — Z6825 Body mass index (BMI) 25.0-25.9, adult: Secondary | ICD-10-CM | POA: Diagnosis not present

## 2017-12-15 DIAGNOSIS — Z79899 Other long term (current) drug therapy: Secondary | ICD-10-CM | POA: Diagnosis not present

## 2017-12-15 DIAGNOSIS — L409 Psoriasis, unspecified: Secondary | ICD-10-CM | POA: Diagnosis not present

## 2017-12-15 DIAGNOSIS — M2550 Pain in unspecified joint: Secondary | ICD-10-CM | POA: Diagnosis not present

## 2017-12-15 DIAGNOSIS — L4050 Arthropathic psoriasis, unspecified: Secondary | ICD-10-CM | POA: Diagnosis not present

## 2017-12-15 DIAGNOSIS — M150 Primary generalized (osteo)arthritis: Secondary | ICD-10-CM | POA: Diagnosis not present

## 2017-12-15 DIAGNOSIS — E663 Overweight: Secondary | ICD-10-CM | POA: Diagnosis not present

## 2018-02-17 DIAGNOSIS — M179 Osteoarthritis of knee, unspecified: Secondary | ICD-10-CM | POA: Diagnosis not present

## 2018-02-17 DIAGNOSIS — M5136 Other intervertebral disc degeneration, lumbar region: Secondary | ICD-10-CM | POA: Diagnosis not present

## 2018-02-17 DIAGNOSIS — M5030 Other cervical disc degeneration, unspecified cervical region: Secondary | ICD-10-CM | POA: Diagnosis not present

## 2018-02-17 DIAGNOSIS — E039 Hypothyroidism, unspecified: Secondary | ICD-10-CM | POA: Diagnosis not present

## 2018-02-17 DIAGNOSIS — E7800 Pure hypercholesterolemia, unspecified: Secondary | ICD-10-CM | POA: Diagnosis not present

## 2018-02-17 DIAGNOSIS — N183 Chronic kidney disease, stage 3 (moderate): Secondary | ICD-10-CM | POA: Diagnosis not present

## 2018-02-17 DIAGNOSIS — G8929 Other chronic pain: Secondary | ICD-10-CM | POA: Diagnosis not present

## 2018-02-17 DIAGNOSIS — L4050 Arthropathic psoriasis, unspecified: Secondary | ICD-10-CM | POA: Diagnosis not present

## 2018-03-08 DIAGNOSIS — M179 Osteoarthritis of knee, unspecified: Secondary | ICD-10-CM | POA: Diagnosis not present

## 2018-03-08 DIAGNOSIS — M25551 Pain in right hip: Secondary | ICD-10-CM | POA: Diagnosis not present

## 2018-03-08 DIAGNOSIS — G8929 Other chronic pain: Secondary | ICD-10-CM | POA: Diagnosis not present

## 2018-03-08 DIAGNOSIS — L4050 Arthropathic psoriasis, unspecified: Secondary | ICD-10-CM | POA: Diagnosis not present

## 2018-03-15 DIAGNOSIS — E663 Overweight: Secondary | ICD-10-CM | POA: Diagnosis not present

## 2018-03-15 DIAGNOSIS — Z6825 Body mass index (BMI) 25.0-25.9, adult: Secondary | ICD-10-CM | POA: Diagnosis not present

## 2018-03-15 DIAGNOSIS — L409 Psoriasis, unspecified: Secondary | ICD-10-CM | POA: Diagnosis not present

## 2018-03-15 DIAGNOSIS — M2550 Pain in unspecified joint: Secondary | ICD-10-CM | POA: Diagnosis not present

## 2018-03-15 DIAGNOSIS — R5383 Other fatigue: Secondary | ICD-10-CM | POA: Diagnosis not present

## 2018-03-15 DIAGNOSIS — Z79899 Other long term (current) drug therapy: Secondary | ICD-10-CM | POA: Diagnosis not present

## 2018-03-15 DIAGNOSIS — M150 Primary generalized (osteo)arthritis: Secondary | ICD-10-CM | POA: Diagnosis not present

## 2018-03-15 DIAGNOSIS — L4050 Arthropathic psoriasis, unspecified: Secondary | ICD-10-CM | POA: Diagnosis not present

## 2018-04-25 DIAGNOSIS — M722 Plantar fascial fibromatosis: Secondary | ICD-10-CM | POA: Diagnosis not present

## 2018-04-25 DIAGNOSIS — G4733 Obstructive sleep apnea (adult) (pediatric): Secondary | ICD-10-CM | POA: Diagnosis not present

## 2018-04-25 DIAGNOSIS — M159 Polyosteoarthritis, unspecified: Secondary | ICD-10-CM | POA: Diagnosis not present

## 2018-04-25 DIAGNOSIS — R69 Illness, unspecified: Secondary | ICD-10-CM | POA: Diagnosis not present

## 2018-04-25 DIAGNOSIS — L4050 Arthropathic psoriasis, unspecified: Secondary | ICD-10-CM | POA: Diagnosis not present

## 2018-04-25 DIAGNOSIS — G8929 Other chronic pain: Secondary | ICD-10-CM | POA: Diagnosis not present

## 2018-04-25 DIAGNOSIS — N183 Chronic kidney disease, stage 3 (moderate): Secondary | ICD-10-CM | POA: Diagnosis not present

## 2018-04-26 DIAGNOSIS — N3281 Overactive bladder: Secondary | ICD-10-CM | POA: Diagnosis not present

## 2018-04-26 DIAGNOSIS — Z79891 Long term (current) use of opiate analgesic: Secondary | ICD-10-CM | POA: Diagnosis not present

## 2018-04-26 DIAGNOSIS — M0550 Rheumatoid polyneuropathy with rheumatoid arthritis of unspecified site: Secondary | ICD-10-CM | POA: Diagnosis not present

## 2018-04-26 DIAGNOSIS — J309 Allergic rhinitis, unspecified: Secondary | ICD-10-CM | POA: Diagnosis not present

## 2018-04-26 DIAGNOSIS — Z79899 Other long term (current) drug therapy: Secondary | ICD-10-CM | POA: Diagnosis not present

## 2018-04-26 DIAGNOSIS — G8929 Other chronic pain: Secondary | ICD-10-CM | POA: Diagnosis not present

## 2018-04-26 DIAGNOSIS — Z809 Family history of malignant neoplasm, unspecified: Secondary | ICD-10-CM | POA: Diagnosis not present

## 2018-04-26 DIAGNOSIS — G4730 Sleep apnea, unspecified: Secondary | ICD-10-CM | POA: Diagnosis not present

## 2018-04-26 DIAGNOSIS — E039 Hypothyroidism, unspecified: Secondary | ICD-10-CM | POA: Diagnosis not present

## 2018-04-26 DIAGNOSIS — R69 Illness, unspecified: Secondary | ICD-10-CM | POA: Diagnosis not present

## 2018-05-02 DIAGNOSIS — Z23 Encounter for immunization: Secondary | ICD-10-CM | POA: Diagnosis not present

## 2018-05-03 DIAGNOSIS — H524 Presbyopia: Secondary | ICD-10-CM | POA: Diagnosis not present

## 2018-07-08 DIAGNOSIS — G8929 Other chronic pain: Secondary | ICD-10-CM | POA: Diagnosis not present

## 2018-07-08 DIAGNOSIS — M545 Low back pain: Secondary | ICD-10-CM | POA: Diagnosis not present

## 2018-07-26 DIAGNOSIS — M150 Primary generalized (osteo)arthritis: Secondary | ICD-10-CM | POA: Diagnosis not present

## 2018-07-26 DIAGNOSIS — M2550 Pain in unspecified joint: Secondary | ICD-10-CM | POA: Diagnosis not present

## 2018-07-26 DIAGNOSIS — Z6825 Body mass index (BMI) 25.0-25.9, adult: Secondary | ICD-10-CM | POA: Diagnosis not present

## 2018-07-26 DIAGNOSIS — L409 Psoriasis, unspecified: Secondary | ICD-10-CM | POA: Diagnosis not present

## 2018-07-26 DIAGNOSIS — Z79899 Other long term (current) drug therapy: Secondary | ICD-10-CM | POA: Diagnosis not present

## 2018-07-26 DIAGNOSIS — E663 Overweight: Secondary | ICD-10-CM | POA: Diagnosis not present

## 2018-07-26 DIAGNOSIS — L4050 Arthropathic psoriasis, unspecified: Secondary | ICD-10-CM | POA: Diagnosis not present

## 2018-07-27 DIAGNOSIS — L4050 Arthropathic psoriasis, unspecified: Secondary | ICD-10-CM | POA: Diagnosis not present

## 2018-07-27 DIAGNOSIS — M19011 Primary osteoarthritis, right shoulder: Secondary | ICD-10-CM | POA: Diagnosis not present

## 2018-07-27 DIAGNOSIS — G894 Chronic pain syndrome: Secondary | ICD-10-CM | POA: Diagnosis not present

## 2018-07-27 DIAGNOSIS — M5030 Other cervical disc degeneration, unspecified cervical region: Secondary | ICD-10-CM | POA: Diagnosis not present

## 2018-08-19 DIAGNOSIS — M545 Low back pain: Secondary | ICD-10-CM | POA: Diagnosis not present

## 2018-08-30 DIAGNOSIS — M545 Low back pain: Secondary | ICD-10-CM | POA: Diagnosis not present

## 2018-09-07 DIAGNOSIS — M545 Low back pain: Secondary | ICD-10-CM | POA: Diagnosis not present

## 2018-09-09 DIAGNOSIS — M5136 Other intervertebral disc degeneration, lumbar region: Secondary | ICD-10-CM | POA: Diagnosis not present

## 2018-09-09 DIAGNOSIS — M419 Scoliosis, unspecified: Secondary | ICD-10-CM | POA: Diagnosis not present

## 2018-09-09 DIAGNOSIS — M545 Low back pain: Secondary | ICD-10-CM | POA: Diagnosis not present

## 2018-09-13 DIAGNOSIS — M545 Low back pain: Secondary | ICD-10-CM | POA: Diagnosis not present

## 2018-09-16 DIAGNOSIS — M545 Low back pain: Secondary | ICD-10-CM | POA: Diagnosis not present

## 2018-09-20 DIAGNOSIS — M545 Low back pain: Secondary | ICD-10-CM | POA: Diagnosis not present

## 2018-09-22 DIAGNOSIS — N401 Enlarged prostate with lower urinary tract symptoms: Secondary | ICD-10-CM | POA: Diagnosis not present

## 2018-09-22 DIAGNOSIS — R3915 Urgency of urination: Secondary | ICD-10-CM | POA: Diagnosis not present

## 2018-09-23 DIAGNOSIS — M545 Low back pain: Secondary | ICD-10-CM | POA: Diagnosis not present

## 2018-10-26 DIAGNOSIS — G8929 Other chronic pain: Secondary | ICD-10-CM | POA: Diagnosis not present

## 2018-10-26 DIAGNOSIS — M5136 Other intervertebral disc degeneration, lumbar region: Secondary | ICD-10-CM | POA: Diagnosis not present

## 2018-10-26 DIAGNOSIS — L4050 Arthropathic psoriasis, unspecified: Secondary | ICD-10-CM | POA: Diagnosis not present

## 2018-10-26 DIAGNOSIS — M179 Osteoarthritis of knee, unspecified: Secondary | ICD-10-CM | POA: Diagnosis not present

## 2018-10-26 DIAGNOSIS — G894 Chronic pain syndrome: Secondary | ICD-10-CM | POA: Diagnosis not present

## 2018-11-02 DIAGNOSIS — E663 Overweight: Secondary | ICD-10-CM | POA: Diagnosis not present

## 2018-11-02 DIAGNOSIS — M2550 Pain in unspecified joint: Secondary | ICD-10-CM | POA: Diagnosis not present

## 2018-11-02 DIAGNOSIS — L409 Psoriasis, unspecified: Secondary | ICD-10-CM | POA: Diagnosis not present

## 2018-11-02 DIAGNOSIS — M150 Primary generalized (osteo)arthritis: Secondary | ICD-10-CM | POA: Diagnosis not present

## 2018-11-02 DIAGNOSIS — L4050 Arthropathic psoriasis, unspecified: Secondary | ICD-10-CM | POA: Diagnosis not present

## 2018-11-02 DIAGNOSIS — Z6825 Body mass index (BMI) 25.0-25.9, adult: Secondary | ICD-10-CM | POA: Diagnosis not present

## 2018-11-02 DIAGNOSIS — Z79899 Other long term (current) drug therapy: Secondary | ICD-10-CM | POA: Diagnosis not present

## 2019-01-17 DIAGNOSIS — M159 Polyosteoarthritis, unspecified: Secondary | ICD-10-CM | POA: Diagnosis not present

## 2019-01-17 DIAGNOSIS — L4050 Arthropathic psoriasis, unspecified: Secondary | ICD-10-CM | POA: Diagnosis not present

## 2019-01-17 DIAGNOSIS — G894 Chronic pain syndrome: Secondary | ICD-10-CM | POA: Diagnosis not present

## 2019-02-01 DIAGNOSIS — L409 Psoriasis, unspecified: Secondary | ICD-10-CM | POA: Diagnosis not present

## 2019-02-01 DIAGNOSIS — Z79899 Other long term (current) drug therapy: Secondary | ICD-10-CM | POA: Diagnosis not present

## 2019-02-01 DIAGNOSIS — L4050 Arthropathic psoriasis, unspecified: Secondary | ICD-10-CM | POA: Diagnosis not present

## 2019-02-01 DIAGNOSIS — M2550 Pain in unspecified joint: Secondary | ICD-10-CM | POA: Diagnosis not present

## 2019-02-01 DIAGNOSIS — M150 Primary generalized (osteo)arthritis: Secondary | ICD-10-CM | POA: Diagnosis not present

## 2019-02-01 DIAGNOSIS — Z6825 Body mass index (BMI) 25.0-25.9, adult: Secondary | ICD-10-CM | POA: Diagnosis not present

## 2019-02-01 DIAGNOSIS — E663 Overweight: Secondary | ICD-10-CM | POA: Diagnosis not present

## 2019-02-23 DIAGNOSIS — R69 Illness, unspecified: Secondary | ICD-10-CM | POA: Diagnosis not present

## 2019-03-01 ENCOUNTER — Other Ambulatory Visit: Payer: Self-pay

## 2019-03-01 DIAGNOSIS — Z20822 Contact with and (suspected) exposure to covid-19: Secondary | ICD-10-CM

## 2019-03-02 LAB — NOVEL CORONAVIRUS, NAA: SARS-CoV-2, NAA: NOT DETECTED

## 2019-03-24 DIAGNOSIS — Z23 Encounter for immunization: Secondary | ICD-10-CM | POA: Diagnosis not present

## 2019-04-17 DIAGNOSIS — S60552A Superficial foreign body of left hand, initial encounter: Secondary | ICD-10-CM | POA: Diagnosis not present

## 2019-04-20 DIAGNOSIS — E039 Hypothyroidism, unspecified: Secondary | ICD-10-CM | POA: Diagnosis not present

## 2019-04-20 DIAGNOSIS — E7800 Pure hypercholesterolemia, unspecified: Secondary | ICD-10-CM | POA: Diagnosis not present

## 2019-04-20 DIAGNOSIS — Z87898 Personal history of other specified conditions: Secondary | ICD-10-CM | POA: Diagnosis not present

## 2019-04-20 DIAGNOSIS — N401 Enlarged prostate with lower urinary tract symptoms: Secondary | ICD-10-CM | POA: Diagnosis not present

## 2019-04-20 DIAGNOSIS — L4050 Arthropathic psoriasis, unspecified: Secondary | ICD-10-CM | POA: Diagnosis not present

## 2019-04-20 DIAGNOSIS — Z0000 Encounter for general adult medical examination without abnormal findings: Secondary | ICD-10-CM | POA: Diagnosis not present

## 2019-04-20 DIAGNOSIS — M159 Polyosteoarthritis, unspecified: Secondary | ICD-10-CM | POA: Diagnosis not present

## 2019-04-20 DIAGNOSIS — G8929 Other chronic pain: Secondary | ICD-10-CM | POA: Diagnosis not present

## 2019-04-20 DIAGNOSIS — G4733 Obstructive sleep apnea (adult) (pediatric): Secondary | ICD-10-CM | POA: Diagnosis not present

## 2019-05-04 DIAGNOSIS — L409 Psoriasis, unspecified: Secondary | ICD-10-CM | POA: Diagnosis not present

## 2019-05-04 DIAGNOSIS — M150 Primary generalized (osteo)arthritis: Secondary | ICD-10-CM | POA: Diagnosis not present

## 2019-05-04 DIAGNOSIS — M2550 Pain in unspecified joint: Secondary | ICD-10-CM | POA: Diagnosis not present

## 2019-05-04 DIAGNOSIS — L4050 Arthropathic psoriasis, unspecified: Secondary | ICD-10-CM | POA: Diagnosis not present

## 2019-05-04 DIAGNOSIS — Z79899 Other long term (current) drug therapy: Secondary | ICD-10-CM | POA: Diagnosis not present

## 2019-05-11 DIAGNOSIS — N401 Enlarged prostate with lower urinary tract symptoms: Secondary | ICD-10-CM | POA: Diagnosis not present

## 2019-05-11 DIAGNOSIS — R3915 Urgency of urination: Secondary | ICD-10-CM | POA: Diagnosis not present

## 2019-05-11 DIAGNOSIS — R351 Nocturia: Secondary | ICD-10-CM | POA: Diagnosis not present

## 2019-05-14 DIAGNOSIS — R69 Illness, unspecified: Secondary | ICD-10-CM | POA: Diagnosis not present

## 2019-05-19 DIAGNOSIS — R69 Illness, unspecified: Secondary | ICD-10-CM | POA: Diagnosis not present

## 2019-06-15 DIAGNOSIS — R197 Diarrhea, unspecified: Secondary | ICD-10-CM | POA: Diagnosis not present

## 2019-06-15 DIAGNOSIS — G8929 Other chronic pain: Secondary | ICD-10-CM | POA: Diagnosis not present

## 2019-06-15 DIAGNOSIS — J3489 Other specified disorders of nose and nasal sinuses: Secondary | ICD-10-CM | POA: Diagnosis not present

## 2019-06-15 DIAGNOSIS — R32 Unspecified urinary incontinence: Secondary | ICD-10-CM | POA: Diagnosis not present

## 2019-06-15 DIAGNOSIS — N400 Enlarged prostate without lower urinary tract symptoms: Secondary | ICD-10-CM | POA: Diagnosis not present

## 2019-06-15 DIAGNOSIS — M069 Rheumatoid arthritis, unspecified: Secondary | ICD-10-CM | POA: Diagnosis not present

## 2019-06-15 DIAGNOSIS — R69 Illness, unspecified: Secondary | ICD-10-CM | POA: Diagnosis not present

## 2019-06-15 DIAGNOSIS — I739 Peripheral vascular disease, unspecified: Secondary | ICD-10-CM | POA: Diagnosis not present

## 2019-06-15 DIAGNOSIS — L4050 Arthropathic psoriasis, unspecified: Secondary | ICD-10-CM | POA: Diagnosis not present

## 2019-06-15 DIAGNOSIS — J309 Allergic rhinitis, unspecified: Secondary | ICD-10-CM | POA: Diagnosis not present

## 2019-06-21 DIAGNOSIS — G894 Chronic pain syndrome: Secondary | ICD-10-CM | POA: Diagnosis not present

## 2019-06-21 DIAGNOSIS — R61 Generalized hyperhidrosis: Secondary | ICD-10-CM | POA: Diagnosis not present

## 2019-08-12 ENCOUNTER — Ambulatory Visit: Payer: Self-pay

## 2019-08-18 ENCOUNTER — Ambulatory Visit: Payer: Medicare HMO | Attending: Internal Medicine

## 2019-08-18 DIAGNOSIS — Z23 Encounter for immunization: Secondary | ICD-10-CM | POA: Insufficient documentation

## 2019-08-18 NOTE — Progress Notes (Signed)
   Covid-19 Vaccination Clinic  Name:  [name] Norris    MRN: [id number] DOB: [birth year]  08/18/2019  [name] Norris was observed post Covid-19 immunization for 15 minutes without incidence. He was provided with Vaccine Information Sheet and instruction to access the V-Safe system.   [name] Norris was instructed to call 911 with any severe reactions post vaccine: Marland Kitchen Difficulty breathing  . Swelling of your face and throat  . A fast heartbeat  . A bad rash all over your body  . Dizziness and weakness    Immunizations Administered    Name Date Dose VIS Date Route   Pfizer COVID-19 Vaccine 08/18/2019 12:00 PM 0.3 mL 06/23/2019 Intramuscular   Manufacturer: [employer], [employer]   Lot: [id number]   NDC: [address]-7

## 2019-08-30 DIAGNOSIS — Z6824 Body mass index (BMI) 24.0-24.9, adult: Secondary | ICD-10-CM | POA: Diagnosis not present

## 2019-08-30 DIAGNOSIS — M150 Primary generalized (osteo)arthritis: Secondary | ICD-10-CM | POA: Diagnosis not present

## 2019-08-30 DIAGNOSIS — M2550 Pain in unspecified joint: Secondary | ICD-10-CM | POA: Diagnosis not present

## 2019-08-30 DIAGNOSIS — L4050 Arthropathic psoriasis, unspecified: Secondary | ICD-10-CM | POA: Diagnosis not present

## 2019-08-30 DIAGNOSIS — Z79899 Other long term (current) drug therapy: Secondary | ICD-10-CM | POA: Diagnosis not present

## 2019-08-30 DIAGNOSIS — L409 Psoriasis, unspecified: Secondary | ICD-10-CM | POA: Diagnosis not present

## 2019-09-12 ENCOUNTER — Ambulatory Visit: Payer: Medicare HMO | Attending: Internal Medicine

## 2019-09-12 DIAGNOSIS — Z23 Encounter for immunization: Secondary | ICD-10-CM | POA: Insufficient documentation

## 2019-09-12 NOTE — Progress Notes (Signed)
   Covid-19 Vaccination Clinic  Name:  TAYRON HUNNELL    MRN: [id number] DOB: [birth year]  09/12/2019  Mr. Wlodarczyk was observed post Covid-19 immunization for 15 minutes without incident. He was provided with Vaccine Information Sheet and instruction to access the V-Safe system.   Mr. Shackleton was instructed to call 911 with any severe reactions post vaccine: Marland Kitchen Difficulty breathing  . Swelling of face and throat  . A fast heartbeat  . A bad rash all over body  . Dizziness and weakness   Immunizations Administered    Name Date Dose VIS Date Route   Pfizer COVID-19 Vaccine 09/12/2019 11:48 AM 0.3 mL 06/23/2019 Intramuscular   Manufacturer: [employer], [employer]   Lot: [id number]   NDC: [address]-7

## 2019-10-24 DIAGNOSIS — R634 Abnormal weight loss: Secondary | ICD-10-CM | POA: Diagnosis not present

## 2019-10-24 DIAGNOSIS — G894 Chronic pain syndrome: Secondary | ICD-10-CM | POA: Diagnosis not present

## 2019-11-20 DIAGNOSIS — L4050 Arthropathic psoriasis, unspecified: Secondary | ICD-10-CM | POA: Diagnosis not present

## 2019-11-20 DIAGNOSIS — G894 Chronic pain syndrome: Secondary | ICD-10-CM | POA: Diagnosis not present

## 2019-12-04 DIAGNOSIS — Z85038 Personal history of other malignant neoplasm of large intestine: Secondary | ICD-10-CM | POA: Diagnosis not present

## 2019-12-04 DIAGNOSIS — E7800 Pure hypercholesterolemia, unspecified: Secondary | ICD-10-CM | POA: Diagnosis not present

## 2019-12-04 DIAGNOSIS — N401 Enlarged prostate with lower urinary tract symptoms: Secondary | ICD-10-CM | POA: Diagnosis not present

## 2019-12-04 DIAGNOSIS — M1990 Unspecified osteoarthritis, unspecified site: Secondary | ICD-10-CM | POA: Diagnosis not present

## 2019-12-04 DIAGNOSIS — M19011 Primary osteoarthritis, right shoulder: Secondary | ICD-10-CM | POA: Diagnosis not present

## 2019-12-04 DIAGNOSIS — I10 Essential (primary) hypertension: Secondary | ICD-10-CM | POA: Diagnosis not present

## 2019-12-04 DIAGNOSIS — M159 Polyosteoarthritis, unspecified: Secondary | ICD-10-CM | POA: Diagnosis not present

## 2019-12-04 DIAGNOSIS — E039 Hypothyroidism, unspecified: Secondary | ICD-10-CM | POA: Diagnosis not present

## 2019-12-04 DIAGNOSIS — M179 Osteoarthritis of knee, unspecified: Secondary | ICD-10-CM | POA: Diagnosis not present

## 2019-12-08 DIAGNOSIS — R61 Generalized hyperhidrosis: Secondary | ICD-10-CM | POA: Diagnosis not present

## 2019-12-08 DIAGNOSIS — R7989 Other specified abnormal findings of blood chemistry: Secondary | ICD-10-CM | POA: Diagnosis not present

## 2019-12-13 DIAGNOSIS — R7989 Other specified abnormal findings of blood chemistry: Secondary | ICD-10-CM | POA: Diagnosis not present

## 2019-12-13 DIAGNOSIS — R61 Generalized hyperhidrosis: Secondary | ICD-10-CM | POA: Diagnosis not present

## 2019-12-18 DIAGNOSIS — M2550 Pain in unspecified joint: Secondary | ICD-10-CM | POA: Diagnosis not present

## 2019-12-18 DIAGNOSIS — L409 Psoriasis, unspecified: Secondary | ICD-10-CM | POA: Diagnosis not present

## 2019-12-18 DIAGNOSIS — L4050 Arthropathic psoriasis, unspecified: Secondary | ICD-10-CM | POA: Diagnosis not present

## 2019-12-18 DIAGNOSIS — M150 Primary generalized (osteo)arthritis: Secondary | ICD-10-CM | POA: Diagnosis not present

## 2019-12-18 DIAGNOSIS — Z6823 Body mass index (BMI) 23.0-23.9, adult: Secondary | ICD-10-CM | POA: Diagnosis not present

## 2019-12-18 DIAGNOSIS — Z79899 Other long term (current) drug therapy: Secondary | ICD-10-CM | POA: Diagnosis not present

## 2020-01-05 DIAGNOSIS — G894 Chronic pain syndrome: Secondary | ICD-10-CM | POA: Diagnosis not present

## 2020-03-29 DIAGNOSIS — G894 Chronic pain syndrome: Secondary | ICD-10-CM | POA: Diagnosis not present

## 2020-03-29 DIAGNOSIS — E039 Hypothyroidism, unspecified: Secondary | ICD-10-CM | POA: Diagnosis not present

## 2020-04-29 DIAGNOSIS — Z23 Encounter for immunization: Secondary | ICD-10-CM | POA: Diagnosis not present

## 2020-06-21 DIAGNOSIS — G894 Chronic pain syndrome: Secondary | ICD-10-CM | POA: Diagnosis not present

## 2020-06-21 DIAGNOSIS — M159 Polyosteoarthritis, unspecified: Secondary | ICD-10-CM | POA: Diagnosis not present

## 2020-06-21 DIAGNOSIS — N183 Chronic kidney disease, stage 3 unspecified: Secondary | ICD-10-CM | POA: Diagnosis not present

## 2020-06-21 DIAGNOSIS — Z125 Encounter for screening for malignant neoplasm of prostate: Secondary | ICD-10-CM | POA: Diagnosis not present

## 2020-06-21 DIAGNOSIS — Z0000 Encounter for general adult medical examination without abnormal findings: Secondary | ICD-10-CM | POA: Diagnosis not present

## 2020-06-21 DIAGNOSIS — E039 Hypothyroidism, unspecified: Secondary | ICD-10-CM | POA: Diagnosis not present

## 2020-08-20 DIAGNOSIS — R102 Pelvic and perineal pain: Secondary | ICD-10-CM | POA: Diagnosis not present

## 2020-09-18 DIAGNOSIS — G894 Chronic pain syndrome: Secondary | ICD-10-CM | POA: Diagnosis not present

## 2020-10-29 DIAGNOSIS — Z111 Encounter for screening for respiratory tuberculosis: Secondary | ICD-10-CM | POA: Diagnosis not present

## 2020-11-19 DIAGNOSIS — N400 Enlarged prostate without lower urinary tract symptoms: Secondary | ICD-10-CM | POA: Diagnosis not present

## 2020-11-19 DIAGNOSIS — Z7689 Persons encountering health services in other specified circumstances: Secondary | ICD-10-CM | POA: Diagnosis not present

## 2020-11-19 DIAGNOSIS — E039 Hypothyroidism, unspecified: Secondary | ICD-10-CM | POA: Diagnosis not present

## 2020-11-19 DIAGNOSIS — G894 Chronic pain syndrome: Secondary | ICD-10-CM | POA: Diagnosis not present

## 2020-11-19 DIAGNOSIS — M1990 Unspecified osteoarthritis, unspecified site: Secondary | ICD-10-CM | POA: Diagnosis not present

## 2020-11-20 DIAGNOSIS — E039 Hypothyroidism, unspecified: Secondary | ICD-10-CM | POA: Diagnosis not present

## 2020-11-20 DIAGNOSIS — N400 Enlarged prostate without lower urinary tract symptoms: Secondary | ICD-10-CM | POA: Diagnosis not present

## 2020-11-20 DIAGNOSIS — E559 Vitamin D deficiency, unspecified: Secondary | ICD-10-CM | POA: Diagnosis not present

## 2020-11-20 DIAGNOSIS — M1990 Unspecified osteoarthritis, unspecified site: Secondary | ICD-10-CM | POA: Diagnosis not present

## 2020-11-20 DIAGNOSIS — G894 Chronic pain syndrome: Secondary | ICD-10-CM | POA: Diagnosis not present

## 2020-11-20 DIAGNOSIS — Z131 Encounter for screening for diabetes mellitus: Secondary | ICD-10-CM | POA: Diagnosis not present

## 2020-11-20 DIAGNOSIS — E785 Hyperlipidemia, unspecified: Secondary | ICD-10-CM | POA: Diagnosis not present

## 2020-11-22 DIAGNOSIS — E559 Vitamin D deficiency, unspecified: Secondary | ICD-10-CM | POA: Diagnosis not present

## 2020-11-22 DIAGNOSIS — E039 Hypothyroidism, unspecified: Secondary | ICD-10-CM | POA: Diagnosis not present

## 2020-11-22 DIAGNOSIS — E785 Hyperlipidemia, unspecified: Secondary | ICD-10-CM | POA: Diagnosis not present

## 2020-11-22 DIAGNOSIS — N400 Enlarged prostate without lower urinary tract symptoms: Secondary | ICD-10-CM | POA: Diagnosis not present

## 2020-11-25 DIAGNOSIS — R195 Other fecal abnormalities: Secondary | ICD-10-CM | POA: Diagnosis not present

## 2020-11-25 DIAGNOSIS — M1990 Unspecified osteoarthritis, unspecified site: Secondary | ICD-10-CM | POA: Diagnosis not present

## 2020-11-25 DIAGNOSIS — F419 Anxiety disorder, unspecified: Secondary | ICD-10-CM | POA: Diagnosis not present

## 2020-11-25 DIAGNOSIS — E039 Hypothyroidism, unspecified: Secondary | ICD-10-CM | POA: Diagnosis not present

## 2020-11-25 DIAGNOSIS — E559 Vitamin D deficiency, unspecified: Secondary | ICD-10-CM | POA: Diagnosis not present

## 2020-11-25 DIAGNOSIS — E785 Hyperlipidemia, unspecified: Secondary | ICD-10-CM | POA: Diagnosis not present

## 2020-12-03 DIAGNOSIS — K912 Postsurgical malabsorption, not elsewhere classified: Secondary | ICD-10-CM | POA: Diagnosis not present

## 2020-12-03 DIAGNOSIS — M549 Dorsalgia, unspecified: Secondary | ICD-10-CM | POA: Diagnosis not present

## 2020-12-03 DIAGNOSIS — G894 Chronic pain syndrome: Secondary | ICD-10-CM | POA: Diagnosis not present

## 2020-12-03 DIAGNOSIS — F419 Anxiety disorder, unspecified: Secondary | ICD-10-CM | POA: Diagnosis not present

## 2020-12-03 DIAGNOSIS — M1990 Unspecified osteoarthritis, unspecified site: Secondary | ICD-10-CM | POA: Diagnosis not present

## 2020-12-04 DIAGNOSIS — M5134 Other intervertebral disc degeneration, thoracic region: Secondary | ICD-10-CM | POA: Diagnosis not present

## 2020-12-04 DIAGNOSIS — M5136 Other intervertebral disc degeneration, lumbar region: Secondary | ICD-10-CM | POA: Diagnosis not present

## 2020-12-12 DIAGNOSIS — Z03818 Encounter for observation for suspected exposure to other biological agents ruled out: Secondary | ICD-10-CM | POA: Diagnosis not present

## 2020-12-12 DIAGNOSIS — M549 Dorsalgia, unspecified: Secondary | ICD-10-CM | POA: Diagnosis not present

## 2020-12-12 DIAGNOSIS — M1990 Unspecified osteoarthritis, unspecified site: Secondary | ICD-10-CM | POA: Diagnosis not present

## 2020-12-12 DIAGNOSIS — F419 Anxiety disorder, unspecified: Secondary | ICD-10-CM | POA: Diagnosis not present

## 2020-12-16 DIAGNOSIS — M1990 Unspecified osteoarthritis, unspecified site: Secondary | ICD-10-CM | POA: Diagnosis not present

## 2020-12-16 DIAGNOSIS — F419 Anxiety disorder, unspecified: Secondary | ICD-10-CM | POA: Diagnosis not present

## 2020-12-16 DIAGNOSIS — U071 COVID-19: Secondary | ICD-10-CM | POA: Diagnosis not present

## 2020-12-16 DIAGNOSIS — Z7189 Other specified counseling: Secondary | ICD-10-CM | POA: Diagnosis not present

## 2021-01-10 DIAGNOSIS — M549 Dorsalgia, unspecified: Secondary | ICD-10-CM | POA: Diagnosis not present

## 2021-01-10 DIAGNOSIS — M1990 Unspecified osteoarthritis, unspecified site: Secondary | ICD-10-CM | POA: Diagnosis not present

## 2021-01-10 DIAGNOSIS — F419 Anxiety disorder, unspecified: Secondary | ICD-10-CM | POA: Diagnosis not present

## 2021-01-24 DIAGNOSIS — M159 Polyosteoarthritis, unspecified: Secondary | ICD-10-CM | POA: Diagnosis not present

## 2021-01-24 DIAGNOSIS — I10 Essential (primary) hypertension: Secondary | ICD-10-CM | POA: Diagnosis not present

## 2021-01-24 DIAGNOSIS — G8929 Other chronic pain: Secondary | ICD-10-CM | POA: Diagnosis not present

## 2021-02-28 DIAGNOSIS — G894 Chronic pain syndrome: Secondary | ICD-10-CM | POA: Diagnosis not present

## 2021-03-14 DIAGNOSIS — G894 Chronic pain syndrome: Secondary | ICD-10-CM | POA: Diagnosis not present

## 2021-03-21 DIAGNOSIS — Z23 Encounter for immunization: Secondary | ICD-10-CM | POA: Diagnosis not present

## 2021-04-11 DIAGNOSIS — E039 Hypothyroidism, unspecified: Secondary | ICD-10-CM | POA: Diagnosis not present

## 2021-04-11 DIAGNOSIS — M159 Polyosteoarthritis, unspecified: Secondary | ICD-10-CM | POA: Diagnosis not present

## 2021-04-11 DIAGNOSIS — N401 Enlarged prostate with lower urinary tract symptoms: Secondary | ICD-10-CM | POA: Diagnosis not present

## 2021-04-11 DIAGNOSIS — E7800 Pure hypercholesterolemia, unspecified: Secondary | ICD-10-CM | POA: Diagnosis not present

## 2021-04-11 DIAGNOSIS — L4050 Arthropathic psoriasis, unspecified: Secondary | ICD-10-CM | POA: Diagnosis not present

## 2021-04-11 DIAGNOSIS — N183 Chronic kidney disease, stage 3 unspecified: Secondary | ICD-10-CM | POA: Diagnosis not present

## 2021-04-11 DIAGNOSIS — I10 Essential (primary) hypertension: Secondary | ICD-10-CM | POA: Diagnosis not present

## 2021-04-11 DIAGNOSIS — G8929 Other chronic pain: Secondary | ICD-10-CM | POA: Diagnosis not present

## 2021-05-16 DIAGNOSIS — R000 Tachycardia, unspecified: Secondary | ICD-10-CM | POA: Diagnosis not present

## 2021-05-16 DIAGNOSIS — G894 Chronic pain syndrome: Secondary | ICD-10-CM | POA: Diagnosis not present

## 2021-06-23 DIAGNOSIS — Z125 Encounter for screening for malignant neoplasm of prostate: Secondary | ICD-10-CM | POA: Diagnosis not present

## 2021-06-23 DIAGNOSIS — N183 Chronic kidney disease, stage 3 unspecified: Secondary | ICD-10-CM | POA: Diagnosis not present

## 2021-06-23 DIAGNOSIS — E7800 Pure hypercholesterolemia, unspecified: Secondary | ICD-10-CM | POA: Diagnosis not present

## 2021-06-23 DIAGNOSIS — Z0000 Encounter for general adult medical examination without abnormal findings: Secondary | ICD-10-CM | POA: Diagnosis not present

## 2021-06-23 DIAGNOSIS — E039 Hypothyroidism, unspecified: Secondary | ICD-10-CM | POA: Diagnosis not present

## 2021-06-27 DIAGNOSIS — Z125 Encounter for screening for malignant neoplasm of prostate: Secondary | ICD-10-CM | POA: Diagnosis not present

## 2021-06-27 DIAGNOSIS — N183 Chronic kidney disease, stage 3 unspecified: Secondary | ICD-10-CM | POA: Diagnosis not present

## 2021-06-27 DIAGNOSIS — E7800 Pure hypercholesterolemia, unspecified: Secondary | ICD-10-CM | POA: Diagnosis not present

## 2021-06-27 DIAGNOSIS — E039 Hypothyroidism, unspecified: Secondary | ICD-10-CM | POA: Diagnosis not present

## 2021-08-12 DIAGNOSIS — E039 Hypothyroidism, unspecified: Secondary | ICD-10-CM | POA: Diagnosis not present

## 2021-08-12 DIAGNOSIS — N183 Chronic kidney disease, stage 3 unspecified: Secondary | ICD-10-CM | POA: Diagnosis not present

## 2021-08-12 DIAGNOSIS — G8929 Other chronic pain: Secondary | ICD-10-CM | POA: Diagnosis not present

## 2021-08-12 DIAGNOSIS — M179 Osteoarthritis of knee, unspecified: Secondary | ICD-10-CM | POA: Diagnosis not present

## 2021-08-12 DIAGNOSIS — E7800 Pure hypercholesterolemia, unspecified: Secondary | ICD-10-CM | POA: Diagnosis not present

## 2021-08-12 DIAGNOSIS — M19011 Primary osteoarthritis, right shoulder: Secondary | ICD-10-CM | POA: Diagnosis not present

## 2021-08-12 DIAGNOSIS — M159 Polyosteoarthritis, unspecified: Secondary | ICD-10-CM | POA: Diagnosis not present

## 2021-08-12 DIAGNOSIS — I10 Essential (primary) hypertension: Secondary | ICD-10-CM | POA: Diagnosis not present

## 2021-08-12 DIAGNOSIS — L4050 Arthropathic psoriasis, unspecified: Secondary | ICD-10-CM | POA: Diagnosis not present

## 2021-10-02 DIAGNOSIS — N401 Enlarged prostate with lower urinary tract symptoms: Secondary | ICD-10-CM | POA: Diagnosis not present

## 2021-10-02 DIAGNOSIS — E7800 Pure hypercholesterolemia, unspecified: Secondary | ICD-10-CM | POA: Diagnosis not present

## 2021-10-02 DIAGNOSIS — G8929 Other chronic pain: Secondary | ICD-10-CM | POA: Diagnosis not present

## 2021-10-02 DIAGNOSIS — I10 Essential (primary) hypertension: Secondary | ICD-10-CM | POA: Diagnosis not present

## 2021-10-08 DIAGNOSIS — M5136 Other intervertebral disc degeneration, lumbar region: Secondary | ICD-10-CM | POA: Diagnosis not present

## 2021-10-08 DIAGNOSIS — M48062 Spinal stenosis, lumbar region with neurogenic claudication: Secondary | ICD-10-CM | POA: Diagnosis not present

## 2021-11-14 DIAGNOSIS — Z85038 Personal history of other malignant neoplasm of large intestine: Secondary | ICD-10-CM | POA: Diagnosis not present

## 2021-11-14 DIAGNOSIS — M159 Polyosteoarthritis, unspecified: Secondary | ICD-10-CM | POA: Diagnosis not present

## 2021-11-14 DIAGNOSIS — G894 Chronic pain syndrome: Secondary | ICD-10-CM | POA: Diagnosis not present

## 2021-11-14 DIAGNOSIS — L4050 Arthropathic psoriasis, unspecified: Secondary | ICD-10-CM | POA: Diagnosis not present

## 2021-11-21 ENCOUNTER — Emergency Department (HOSPITAL_COMMUNITY)
Admission: EM | Admit: 2021-11-21 | Discharge: 2021-11-21 | Disposition: A | Discharge status: Home / Self Care | Payer: Medicare HMO | Attending: Emergency Medicine | Admitting: Emergency Medicine

## 2021-11-21 ENCOUNTER — Encounter (HOSPITAL_COMMUNITY): Payer: Self-pay

## 2021-11-21 ENCOUNTER — Other Ambulatory Visit: Payer: Self-pay

## 2021-11-21 DIAGNOSIS — R45851 Suicidal ideations: Secondary | ICD-10-CM | POA: Insufficient documentation

## 2021-11-21 DIAGNOSIS — F22 Delusional disorders: Secondary | ICD-10-CM | POA: Diagnosis not present

## 2021-11-21 DIAGNOSIS — F29 Unspecified psychosis not due to a substance or known physiological condition: Secondary | ICD-10-CM | POA: Diagnosis not present

## 2021-11-21 DIAGNOSIS — Z79899 Other long term (current) drug therapy: Secondary | ICD-10-CM | POA: Diagnosis not present

## 2021-11-21 DIAGNOSIS — R000 Tachycardia, unspecified: Secondary | ICD-10-CM | POA: Diagnosis not present

## 2021-11-21 DIAGNOSIS — Z91040 Latex allergy status: Secondary | ICD-10-CM | POA: Diagnosis not present

## 2021-11-21 DIAGNOSIS — R4582 Worries: Secondary | ICD-10-CM | POA: Diagnosis not present

## 2021-11-21 DIAGNOSIS — Z711 Person with feared health complaint in whom no diagnosis is made: Secondary | ICD-10-CM | POA: Diagnosis not present

## 2021-11-21 HISTORY — DX: Dorsalgia, unspecified: M54.9

## 2021-11-21 LAB — CBC WITH DIFFERENTIAL/PLATELET
Abs Immature Granulocytes: 0.02 10*3/uL (ref 0.00–0.07)
Basophils Absolute: 0.1 10*3/uL (ref 0.0–0.1)
Basophils Relative: 1 %
Eosinophils Absolute: 0.2 10*3/uL (ref 0.0–0.5)
Eosinophils Relative: 2 %
HCT: 43.1 % (ref 39.0–52.0)
Hemoglobin: 14.5 g/dL (ref 13.0–17.0)
Immature Granulocytes: 0 %
Lymphocytes Relative: 16 %
Lymphs Abs: 1.2 10*3/uL (ref 0.7–4.0)
MCH: 33 pg (ref 26.0–34.0)
MCHC: 33.6 g/dL (ref 30.0–36.0)
MCV: 98.2 fL (ref 80.0–100.0)
Monocytes Absolute: 0.8 10*3/uL (ref 0.1–1.0)
Monocytes Relative: 10 %
Neutro Abs: 5.5 10*3/uL (ref 1.7–7.7)
Neutrophils Relative %: 71 %
Platelets: 215 10*3/uL (ref 150–400)
RBC: 4.39 MIL/uL (ref 4.22–5.81)
RDW: 12.4 % (ref 11.5–15.5)
WBC: 7.7 10*3/uL (ref 4.0–10.5)
nRBC: 0 % (ref 0.0–0.2)

## 2021-11-21 LAB — URINALYSIS, ROUTINE W REFLEX MICROSCOPIC
Bilirubin Urine: NEGATIVE
Glucose, UA: NEGATIVE mg/dL
Hgb urine dipstick: NEGATIVE
Ketones, ur: 5 mg/dL — AB
Leukocytes,Ua: NEGATIVE
Nitrite: NEGATIVE
Protein, ur: NEGATIVE mg/dL
Specific Gravity, Urine: 1.032 — ABNORMAL HIGH (ref 1.005–1.030)
pH: 5 (ref 5.0–8.0)

## 2021-11-21 LAB — COMPREHENSIVE METABOLIC PANEL
ALT: 22 U/L (ref 0–44)
AST: 20 U/L (ref 15–41)
Albumin: 3.8 g/dL (ref 3.5–5.0)
Alkaline Phosphatase: 44 U/L (ref 38–126)
Anion gap: 5 (ref 5–15)
BUN: 33 mg/dL — ABNORMAL HIGH (ref 8–23)
CO2: 26 mmol/L (ref 22–32)
Calcium: 9.2 mg/dL (ref 8.9–10.3)
Chloride: 108 mmol/L (ref 98–111)
Creatinine, Ser: 1.17 mg/dL (ref 0.61–1.24)
GFR, Estimated: >60 mL/min (ref >60)
Glucose, Bld: 107 mg/dL — ABNORMAL HIGH (ref 70–99)
Potassium: 3.8 mmol/L (ref 3.5–5.1)
Sodium: 139 mmol/L (ref 135–145)
Total Bilirubin: 0.7 mg/dL (ref 0.3–1.2)
Total Protein: 6.3 g/dL — ABNORMAL LOW (ref 6.5–8.1)

## 2021-11-21 LAB — RAPID URINE DRUG SCREEN, HOSP PERFORMED
Amphetamines: NOT DETECTED
Barbiturates: NOT DETECTED
Benzodiazepines: NOT DETECTED
Cocaine: NOT DETECTED
Opiates: POSITIVE — AB
Tetrahydrocannabinol: NOT DETECTED

## 2021-11-21 LAB — SALICYLATE LEVEL: Salicylate Lvl: <7 mg/dL — ABNORMAL LOW (ref 7.0–30.0)

## 2021-11-21 LAB — ETHANOL: Alcohol, Ethyl (B): <10 mg/dL (ref <10)

## 2021-11-21 LAB — ACETAMINOPHEN LEVEL: Acetaminophen (Tylenol), Serum: <10 ug/mL — ABNORMAL LOW (ref 10–30)

## 2021-11-21 MED ORDER — HYDROCODONE-ACETAMINOPHEN 5-325 MG PO TABS: 1.0000 | ORAL_TABLET | Freq: Once | ORAL | Status: DC

## 2021-11-21 NOTE — Discharge Instructions (Addendum)
Please make sure you take your medicines as prescribed and do not take more of your pain medicine. ? ?Please follow-up with your primary care doctor ? ?Go to behavioral health urgent care if you have any behavioral health needs ? ?Return to ER if you have thoughts of harming yourself or others or hallucinations. ?

## 2021-11-21 NOTE — ED Provider Notes (Signed)
?[name] Norris ?Provider Note ? ? ?CSN: [id number] ?Arrival date & time: 11/21/21  1948 ? ?  ? ?History ? ?Chief Complaint  ?Patient presents with  ? Psychiatric Evaluation  ?  Pt arrived via EMS from home, son called PD reporting pt left a voicemail making suicidal remarks. EMS states PD is en route with IVC paperwork.   ? ? ?[name] Norris is a 81 y.o. male who presents to the ED via EMS after making suicidal remarks. The patient states that this is all a misunderstanding and that he stated "he does not want to be here" but did not mean that he is going to end his life nor does he want to end his life. The patient has no thoughts of harming himself or others at this time. He has no active suicidal plan. He has never attempted suicide in the past, but does endorse having suicidal ideations about 40-50 years ago. The patient does have one gun in his home but states that he has never thought about using it to end his life.  ? ?Per the patient's daughter, the patient had left a voicemail to his son stating that he took many hydrocodone pills and that "he would not wake up in the morning". They called the neighbors to check on him and called EMS, as well. The patient's daughter also states that he has never made comments like this before, but states that he has been depressed especially over the last year since his wife passed away. She also states that the patient has been paranoid as he believes people are going to come into his home and kill him.  ? ?The history is provided by the patient and a relative.  ? ?  ? ?Home Medications ?Prior to Admission medications   ?Medication Sig Start Date End Date Taking? Authorizing Provider  ?acetaminophen (TYLENOL) 500 MG tablet Take 500 mg by mouth every 6 (six) hours as needed for mild pain.    [provider]  ?b complex vitamins tablet Take 1 tablet by mouth daily.    [provider]  ?Calcium-Magnesium-Vitamin D (CALCIUM  500 PO) Take 1 tablet by mouth daily.    [provider]  ?fluticasone (FLONASE) 50 MCG/ACT nasal spray Place 2 sprays into both nostrils daily. 05/04/15   [provider]  ?Multiple Vitamins-Minerals (MENS 50+ MULTI VITAMIN/MIN) TABS Take 1 tablet by mouth daily.    [provider]  ?tamsulosin (FLOMAX) 0.4 MG CAPS capsule Take 1 capsule by mouth daily. At bed time 05/13/15   [provider]  ?traMADol (ULTRAM) 50 MG tablet Take 2 tablets by mouth every 8 (eight) hours as needed. 06/21/15   [provider]  ?vitamin C (ASCORBIC ACID) 500 MG tablet Take 500 mg by mouth daily.    [provider]  ?VITAMIN D, CHOLECALCIFEROL, PO Take 1 tablet by mouth daily.    [provider]  ?VITAMIN E PO Take 1 capsule by mouth daily.    [provider]  ?   ? ?Allergies    ?Latex   ? ?Review of Systems   ?Review of Systems  ?Constitutional:  Negative for chills and fever.  ?HENT: Negative.    ?Respiratory:  Negative for cough and shortness of breath.   ?Cardiovascular:  Negative for chest pain.  ?Gastrointestinal:  Negative for diarrhea, nausea and vomiting.  ?Genitourinary: Negative.   ?Psychiatric/Behavioral:  Negative for agitation, confusion, hallucinations, self-injury and suicidal ideas. The patient is not  nervous/anxious.   ? ?Physical Exam ?Updated Vital Signs ?BP (!) 152/98   Pulse 97   Temp 98 ?F (36.7 ?C)   Resp 18   Ht 5\' 10"  (1.778 m)   Wt 84.8 kg   SpO2 98%   BMI 26.83 kg/m?  ?Physical Exam ?Constitutional:   ?   General: He is not in acute distress. ?   Appearance: Normal appearance.  ?Cardiovascular:  ?   Rate and Rhythm: Regular rhythm. Tachycardia present.  ?   Pulses: Normal pulses.  ?   Heart sounds: No murmur heard. ?Pulmonary:  ?   Effort: Pulmonary effort is normal. No respiratory distress.  ?   Breath sounds: Normal breath sounds.  ?Abdominal:  ?   General: Bowel sounds are normal. There is no distension.  ?   Palpations: Abdomen  is soft.  ?   Tenderness: There is no abdominal tenderness.  ?Musculoskeletal:  ?   Right lower leg: No edema.  ?   Left lower leg: No edema.  ?Skin: ?   General: Skin is warm and dry.  ?Neurological:  ?   General: No focal deficit present.  ?   Mental Status: He is alert and oriented to person, place, and time.  ?Psychiatric:     ?   Mood and Affect: Mood normal.     ?   Behavior: Behavior normal.     ?   Thought Content: Thought content normal.     ?   Judgment: Judgment normal.  ? ? ?ED Results / Procedures / Treatments   ?Labs ?(all labs ordered are listed, but only abnormal results are displayed) ?Labs Reviewed  ?COMPREHENSIVE METABOLIC PANEL - Abnormal; Notable for the following components:  ?    Result Value  ? Glucose, Bld 107 (*)   ? BUN 33 (*)   ? Total Protein 6.3 (*)   ? All other components within normal limits  ?URINALYSIS, ROUTINE W REFLEX MICROSCOPIC - Abnormal; Notable for the following components:  ? Specific Gravity, Urine 1.032 (*)   ? Ketones, ur 5 (*)   ? All other components within normal limits  ?RAPID URINE DRUG SCREEN, HOSP PERFORMED - Abnormal; Notable for the following components:  ? Opiates POSITIVE (*)   ? All other components within normal limits  ?CBC WITH DIFFERENTIAL/PLATELET  ?ETHANOL  ?SALICYLATE LEVEL  ?ACETAMINOPHEN LEVEL  ? ? ?EKG ?None ? ?Radiology ?No results found. ? ?Procedures ?Procedures  ? ? ?Medications Ordered in ED ?Medications  ?HYDROcodone-acetaminophen (NORCO/VICODIN) 5-325 MG per tablet 1 tablet (0 tablets Oral Hold 11/21/21 2157)  ? ? ?ED Course/ Medical Decision Making/ A&P ?  ?                        ?Medical Decision Making ?Amount and/or Complexity of Data Reviewed ?Labs: ordered. ? ?Risk ?Prescription drug management. ? ? ?[name] is an 81 yo male who presents to the ED via EMS after making suicidal remarks on a voicemail to his son. Per the patient's daughter, the patient reportedly took many hydrocodone pills and stated that "he would not wake up  in the morning". Patient was IVC'd and brought to the ED. The patient adamantly denies taking any additional hydrocodone pills and states that this is all a misunderstanding. Per the patient, EMS counted his hydrocodone pills and none we missing. ? ?CBC with no leukocytosis or anemia. UA with no nitrites or leukocytes. CMP with no AKI or liver function  abnormalities. UDS was just positive for opiates (patient prescribed hydrocodone). ? ?Gave the patient one dose of norco (home dose) for his chronic back pain while he was in the ED.  ? ?Had an extensive conversation with the patient and his daughter Lyla Son about this incident at 2113. The patient continues to adamantly deny any SI/HI or any hallucinations and states that this is all a big misunderstanding. The patient's daughter believes that the patient is in his right state of mind and she would like to have his IVC paperwork rescinded so the patient can return to his home at this time. Patient's daughter understands that he will be discharged to home prior to psychiatric evaluation. Will rescind IVC paperwork at this time and discharge the patient to home.  ? ? ? ? ? ? ? ?Final Clinical Impression(s) / ED Diagnoses ?Final diagnoses:  ?Worried well  ? ? ?Rx / DC Orders ?ED Discharge Orders   ? ? None  ? ?  ? ? ?  ?Chauncey Mann, DO ?11/21/21 2202 ? ?  ?Charlynne Pander, MD ?11/21/21 2303 ? ?

## 2021-12-27 ENCOUNTER — Other Ambulatory Visit: Payer: Self-pay

## 2021-12-27 ENCOUNTER — Emergency Department (HOSPITAL_COMMUNITY)
Admission: EM | Admit: 2021-12-27 | Discharge: 2021-12-29 | Disposition: A | Discharge status: Other Acute Inpatient Hospital | Payer: Medicare HMO | Attending: Emergency Medicine | Admitting: Emergency Medicine

## 2021-12-27 ENCOUNTER — Ambulatory Visit (INDEPENDENT_AMBULATORY_CARE_PROVIDER_SITE_OTHER)
Admission: EM | Admit: 2021-12-27 | Discharge: 2021-12-27 | Disposition: A | Discharge status: Other Acute Inpatient Hospital | Payer: Medicare HMO | Source: Home / Self Care

## 2021-12-27 ENCOUNTER — Encounter (HOSPITAL_COMMUNITY): Payer: Self-pay | Admitting: Emergency Medicine

## 2021-12-27 DIAGNOSIS — Z91040 Latex allergy status: Secondary | ICD-10-CM | POA: Insufficient documentation

## 2021-12-27 DIAGNOSIS — F333 Major depressive disorder, recurrent, severe with psychotic symptoms: Secondary | ICD-10-CM | POA: Insufficient documentation

## 2021-12-27 DIAGNOSIS — Z9151 Personal history of suicidal behavior: Secondary | ICD-10-CM | POA: Insufficient documentation

## 2021-12-27 DIAGNOSIS — Z20822 Contact with and (suspected) exposure to covid-19: Secondary | ICD-10-CM | POA: Diagnosis not present

## 2021-12-27 DIAGNOSIS — F29 Unspecified psychosis not due to a substance or known physiological condition: Secondary | ICD-10-CM | POA: Diagnosis not present

## 2021-12-27 DIAGNOSIS — T887XXA Unspecified adverse effect of drug or medicament, initial encounter: Secondary | ICD-10-CM | POA: Diagnosis not present

## 2021-12-27 DIAGNOSIS — N179 Acute kidney failure, unspecified: Secondary | ICD-10-CM | POA: Diagnosis not present

## 2021-12-27 DIAGNOSIS — T50902A Poisoning by unspecified drugs, medicaments and biological substances, intentional self-harm, initial encounter: Secondary | ICD-10-CM

## 2021-12-27 DIAGNOSIS — G8929 Other chronic pain: Secondary | ICD-10-CM | POA: Insufficient documentation

## 2021-12-27 DIAGNOSIS — T402X2A Poisoning by other opioids, intentional self-harm, initial encounter: Secondary | ICD-10-CM | POA: Diagnosis not present

## 2021-12-27 DIAGNOSIS — R9431 Abnormal electrocardiogram [ECG] [EKG]: Secondary | ICD-10-CM | POA: Diagnosis not present

## 2021-12-27 DIAGNOSIS — R45851 Suicidal ideations: Secondary | ICD-10-CM | POA: Insufficient documentation

## 2021-12-27 DIAGNOSIS — T50904A Poisoning by unspecified drugs, medicaments and biological substances, undetermined, initial encounter: Secondary | ICD-10-CM | POA: Diagnosis not present

## 2021-12-27 LAB — CBC
HCT: 46.2 % (ref 39.0–52.0)
Hemoglobin: 15.1 g/dL (ref 13.0–17.0)
MCH: 33.1 pg (ref 26.0–34.0)
MCHC: 32.7 g/dL (ref 30.0–36.0)
MCV: 101.3 fL — ABNORMAL HIGH (ref 80.0–100.0)
Platelets: 245 10*3/uL (ref 150–400)
RBC: 4.56 MIL/uL (ref 4.22–5.81)
RDW: 12.1 % (ref 11.5–15.5)
WBC: 12.5 10*3/uL — ABNORMAL HIGH (ref 4.0–10.5)
nRBC: 0 % (ref 0.0–0.2)

## 2021-12-27 LAB — COMPREHENSIVE METABOLIC PANEL
ALT: 21 U/L (ref 0–44)
AST: 33 U/L (ref 15–41)
Albumin: 4.1 g/dL (ref 3.5–5.0)
Alkaline Phosphatase: 54 U/L (ref 38–126)
Anion gap: 11 (ref 5–15)
BUN: 46 mg/dL — ABNORMAL HIGH (ref 8–23)
CO2: 23 mmol/L (ref 22–32)
Calcium: 9.1 mg/dL (ref 8.9–10.3)
Chloride: 102 mmol/L (ref 98–111)
Creatinine, Ser: 2.06 mg/dL — ABNORMAL HIGH (ref 0.61–1.24)
GFR, Estimated: 32 mL/min — ABNORMAL LOW (ref >60)
Glucose, Bld: 129 mg/dL — ABNORMAL HIGH (ref 70–99)
Potassium: 4.3 mmol/L (ref 3.5–5.1)
Sodium: 136 mmol/L (ref 135–145)
Total Bilirubin: 0.9 mg/dL (ref 0.3–1.2)
Total Protein: 6.3 g/dL — ABNORMAL LOW (ref 6.5–8.1)

## 2021-12-27 LAB — RAPID URINE DRUG SCREEN, HOSP PERFORMED
Amphetamines: NOT DETECTED
Barbiturates: NOT DETECTED
Benzodiazepines: NOT DETECTED
Cocaine: NOT DETECTED
Opiates: POSITIVE — AB
Tetrahydrocannabinol: NOT DETECTED

## 2021-12-27 NOTE — Progress Notes (Signed)
12/27/21 1802  [facility] Triage Screening (Walk-ins at [facility] only)  How Did You Hear About [place]? Legal System  What Is the Reason for Your Visit/Call Today? Pt reports being escorted to [facility] by the [facility] department. Pt shares that he overdosed on hydrocodone. Pt states "I took a half of bottle; well a handful and I still woke up." Pt shares at 7pm on 12/26/21 he was in pain 10/10 so he decided to keep taking his pain medication that he is prescribed. Pt shares that the [place] will no longer prescribe him opioids because he keeps overdosing. Pt states that he must make it till next month so that his other doctor can prescribe him another bottle of pain pills. 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 I just want to die so this circle can be over with." Pt then shares "I get clean but then I am forced to do it all over again because I am in so much pain." Pt acknowledges that he has a problem but reports that he does not know what to do anymore. Pt states that he is a widow and that his wife passed away last year. Pt reports that he and his wife was married for 59 years. Pt reports that now he lives alone but states he did live in an ALF with his wife in [place], but the ALF would make he wait to take pain medication and pt reports that he can not wait. Pt then show this CSW that his teeth are falling out because of the pain medication. Pt states "yes losing teeth, that's a side effect." Pt then goes on to share "I do not want to live like this, and I know I am going to be dead in a month." CSW observes pt scratching himself repeatedly. Pt states that he does have children and grandchildren but reports that they live in [place] and [place]. Pt denies drinking alcohol. Pt admits to experiencing hallucinating by sharing "I have hallucinations and they are bad." Pt could not describe the hallucinations. This pt is Emergent.  How Long Has This  Been Causing You Problems? 1-6 months  Have You Recently Had Any Thoughts About Hurting Yourself? Yes  How long ago did you have thoughts about hurting yourself? Today, taking pills.  Are You Planning to Commit Suicide/Harm Yourself At This time? Yes ('When I am in pain.")  Have you Recently Had Thoughts About Hurting Someone Karolee Ohs? No  Are You Planning To Harm Someone At This Time? No  Are you currently experiencing any auditory, visual or other hallucinations? Yes  Please explain the hallucinations you are currently experiencing: "I am just having halluncinations."  Have You Used Any Alcohol or Drugs in the Past 24 Hours? Yes  How long ago did you use Drugs or Alcohol? prescribed pain medication  What Did You Use and How Much? "half bottle/ handful."  Do you have any current medical co-morbidities that require immediate attention? Yes  Please describe current medical co-morbidities that require immediate attention: Arthritis.  Clinician description of patient physical appearance/behavior: Pt appears under the influence as evident by pt scratching and jumping topic to topic, very hypersensitive.  What Do You Feel Would Help You the Most Today? Treatment for Depression or other mood problem;Alcohol or Drug Use Treatment  If access to [facility] was not available, would you have sought care  in the Emergency Department? Yes  Determination of Need Emergent (2 hours)  Options For Referral Inpatient Hospitalization;Mobile Crisis;Medication Management;Chemical Dependency Intensive Outpatient Therapy (CDIOP);Intensive Outpatient Therapy   Maryjean Ka, MSW, [facility] 12/27/2021 9:29 PM

## 2021-12-27 NOTE — ED Notes (Signed)
Report called to Leta Jungling, RN at [facility] for pt transfer via ems.

## 2021-12-27 NOTE — ED Provider Notes (Signed)
Behavioral Health Urgent Care Medical Screening Exam  Patient Name: [name] Norris MRN: [id number] Date of Evaluation: 12/28/21 Chief Complaint:  Suicidal ideation Diagnosis:  Final diagnoses:  Severe episode of recurrent major depressive disorder, with psychotic features (HCC)    History of Present illness: [name] Norris prefers to be called "[name] Norris" is a 81 y.o. male presenting voluntarily to [facility] for suicidal ideation brought in [employer]. Patient reports that he has been having these thoughts off and on for about years which seem to correlate with the passing of his wife on June 6th, 2022 after 59 years of marriage. Patient now lives alone and receives meals on wheels and reports that someone  comes to home to help with cleaning. Patient states that he has two adult children that live out of state. His son is [name] Norris and can be reached at [phone]. Patient reports that he has a lot of pain from arthritis and that he is prescribed hydrocodone. Patient reports that he is tired of living with chronic pain from arthritis. Patient endorse having hallucinations today but is not able to describe the hallucinations.  Patient denies any illicit drug use or alcohol use and not currently being followed by a mental health provider or taking any mental health medications. Patient is alert oriented x3, calm, cooperative with a blunted affect and slightly impaired hearing but   and endorses having hallucinations that started six months ago.  At times during the interview patient would seem lose his train of thought, unclear if it is cognitive impairment or thought blocking.  Patient reports that about a year ago he attempted suicide by placing a plastic bag over his head and after that attempt he tried to overdose on his hydrocodone. [name] Norris reports that he has a gun in the home and he has been considering that as an option for suicide as well. Patient meets the criteria for inpatient geropsych  treatment due to his suicidal ideations with plan, intent and means. Patient will be transferred to [facility] ED to be medically cleared while social work investigates an appropriate and available bed for patient.  Psychiatric Specialty Exam  Presentation  General Appearance:Casual  Eye Contact:Good  Speech:Clear and Coherent  Speech Volume:Normal  Handedness:Right   Mood and Affect  Mood:Depressed  Affect:Flat   Thought Process  Thought Processes:Linear  Descriptions of Associations:Intact  Orientation:Full (Time, Place and Person)  Thought Content:Perseveration    Hallucinations:Visual; Auditory unable to describe unable to describe  Ideas of Reference:Delusions  Suicidal Thoughts:Yes, Active  Homicidal Thoughts:No   Sensorium  Memory:Recent Fair; Remote Fair; Immediate Fair  Judgment:Poor  Insight:Poor   Executive Functions  Concentration:Fair  Attention Span:Fair  Recall:Fair  Fund of Knowledge:Good  Language:Good   Psychomotor Activity  Psychomotor Activity:Normal   Assets  Assets:Communication Skills; Housing; Health and safety inspector; Physical Health   Sleep  Sleep:No data recorded Number of hours: -1   No data recorded  Physical Exam: Physical Exam HENT:     Head: Normocephalic.     Nose: Nose normal.  Eyes:     Pupils: Pupils are equal, round, and reactive to light.  Cardiovascular:     Rate and Rhythm: Normal rate.  Pulmonary:     Effort: Pulmonary effort is normal.  Abdominal:     General: Abdomen is flat.  Musculoskeletal:        General: Normal range of motion.     Cervical back: Normal range of motion.  Skin:    General: Skin  is warm.  Neurological:     General: No focal deficit present.     Mental Status: He is alert.  Psychiatric:        Attention and Perception: He perceives auditory and visual hallucinations.        Mood and Affect: Mood is depressed.        Speech: Speech normal.         Behavior: Behavior is agitated.        Thought Content: Thought content includes suicidal ideation. Thought content includes suicidal plan.        Cognition and Memory: Cognition is impaired.        Judgment: Judgment normal.    Review of Systems  Constitutional: Negative.   HENT:  Positive for hearing loss.   Eyes: Negative.   Respiratory: Negative.    Cardiovascular:  Positive for chest pain.  Gastrointestinal:  Positive for constipation.  Musculoskeletal:  Positive for falls and joint pain.  Skin:  Positive for itching.  Neurological: Negative.   Psychiatric/Behavioral:  Positive for depression, hallucinations and suicidal ideas.    Blood pressure 130/81, pulse 78, temperature (!) 97.4 F (36.3 C), resp. rate 16, SpO2 96 %. There is no height or weight on file to calculate BMI.  Musculoskeletal: Strength & Muscle Tone: within normal limits Gait & Station: unsteady Patient leans: N/A   [facility] MSE Discharge Disposition for Follow up and Recommendations: Based on my evaluation I certify that psychiatric inpatient services furnished can reasonably be expected to improve the patient's condition which I recommend transfer to an appropriate accepting facility.  Patient will be transferred to [facility] for medical clearance while social work Geophysicist/field seismologist beds available.    Jasper Riling, NP 12/28/2021, 12:48 AM

## 2021-12-27 NOTE — BH Assessment (Signed)
Pt gave verbal consent to speak to his son, [name] Norris [phone] regarding treatment recommendations. [name] Norris says he is Pt's POA.   [name] Norris, [facility], [facility] Triage Specialist [phone]

## 2021-12-27 NOTE — ED Notes (Signed)
EMS arrived and received pt for transport. Paperwork given to EMS and transport executed.

## 2021-12-27 NOTE — BH Assessment (Signed)
Comprehensive Clinical Assessment (CCA) Note  12/27/2021 [facility] [id number]  DISPOSITION: Completed CCA accompanied by Roselyn Bering, NP who completed MSE and recommended inpatient geriatric-psychiatry. Pt will be transferred to [facility] for medical clearance and placement.  The patient demonstrates the following risk factors for suicide: Chronic risk factors for suicide include: psychiatric disorder of major depressive disorder, substance use disorder, previous suicide attempts by overdose and putting a plastic bag over his head, medical illness arthritis, chronic pain, and demographic factors (male, >82 y/o). Acute risk factors for suicide include: social withdrawal/isolation and loss (financial, interpersonal, professional). Protective factors for this patient include: responsibility to others (children, family). Considering these factors, the overall suicide risk at this point appears to be high. Patient is not appropriate for outpatient follow up.  Flowsheet Row ED from 12/27/2021 in [facility] ED from 11/21/2021 in [facility]  C-SSRS RISK CATEGORY High Risk No Risk      Pt is an 81 year old widowed male, Joeangel "Randyl Arrant, who presents unaccompanied to [facility] via Patent examiner. Pt states "I took a half of bottle; well a handful and I still woke up." Pt shares at 7pm on 12/26/21 he was in pain 10/10 so he decided to keep taking his pain medication that he is prescribed. He says his neighbors found him. They encouraged Pt to contact his children and to call 911 for help.  Pt shares that the [place] will no longer prescribe him opioids because he keeps overdosing. Pt states that he must make it till next month so that his other doctor can prescribe him another bottle of pain pills. 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 I just want to die so this circle can  be over with."  Pt then goes on to share "I do not want to live like this, and I know I am going to be dead in a month." Pt reports he attempted suicide twice last year, once by intentional overdose and one by putting a plastic bag over his head. Pt reports he has a gun and he has considered shooting himself. He denies current homicidal ideation. Pt reports he is experiencing auditory and visual hallucinations that he cannot describe. During assessment, Pt appears at times to be distracted by internal stimuli, staring at different areas of the room and acknowledging he is seeing things. He also says his skin itches. Pt denies use of alcohol or illicit drugs.  Pt identifies his chronic pain and associated use of pain medications as his primary stressor. He states, "I get clean but then I am forced to do it all over again because I am in so much pain." Pt acknowledges that he has a problem but reports that he does not know what to do anymore. Pt states that he is a widow and that his wife passed away 12/26/2020. Pt reports that he and his wife was married for 59 years. Pt reports that now he lives alone but states he did live in an ALF with his wife in [place], but the ALF would make him wait to take pain medication and he reports that he cannot wait. Pt states that his teeth are falling out because of the pain medication. He says there are numerous things that upset him, such as problems in the church, and other concerns. He says he still drives his car. He has  a cleaning staff that helps maintain his house. Pt states that he does have children and grandchildren but reports that they live in [place] and [place].  With Pt's consent, TTS spoke with Pt's son, Curt Oatis at [phone]. Gregary Signs says he is currently in [place] and is flying to [place] tomorrow to assist Pt . He says he is Pt's healthcare POA. Informed Pt's son of recommendation for inpatient psychiatric treatment and that Pt is being  transferred to [facility] for medical clearance.  Pt is casually dressed, alert and oriented x4. Pt speaks in a clear tone, at moderate volume and normal pace. Pt has a mild hearing impairment. Motor behavior appears slightly restless with Pt scratching his skin. Eye contact is good. Pt's mood is depressed and anxious, affect is congruent with mood. Thought process is coherent. He is distracted at times by hallucinations and appears to recognize when he is experiencing hallucinations. He is cooperative and says he is willing to sign voluntarily into a psychiatric facility.    Chief Complaint:  Chief Complaint  Patient presents with   Addiction Problem    Pt reports being addicted to hydrocodone.   Visit Diagnosis:  F32.2 Major depressive disorder, Single episode, Severe F11.20 Opioid use disorder, Severe  CCA Screening, Triage and Referral (STR)  Patient Reported Information How did you hear about [place]? Legal System  What Is the Reason for Your Visit/Call Today? Pt reports being escorted to [facility] by the [facility] department. Pt shares that he overdosed on hydrocodone. Pt states "I took a half of bottle; well a handful and I still woke up." Pt shares at 7pm on 12/26/21 he was in pain 10/10 so he decided to keep taking his pain medication that he is prescribed. Pt shares that the [place] will no longer prescribe him opioids because he keeps overdosing. Pt states that he must make it till next month so that his other doctor can prescribe him another bottle of pain pills. 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 I just want to die so this circle can be over with." Pt then shares "I get clean but then I am forced to do it all over again because I am in so much pain." Pt acknowledges that he has a problem but reports that he does not know what to do anymore. Pt states that he is a widow and that his wife passed away last year. Pt reports that  he and his wife was married for 59 years. Pt reports that now he lives alone but states he did live in an ALF with his wife in [place], but the ALF would make he wait to take pain medication and pt reports that he can not wait. Pt then show this CSW that his teeth are falling out because of the pain medication. Pt states "yes losing teeth, that's a side effect." Pt then goes on to share "I do not want to live like this, and I know I am going to be dead in a month." CSW observes pt scratching himself repeatedly. Pt states that he does have children and grandchildren but reports that they live in [place] and [place]. Pt denies drinking alcohol. Pt admits to experiencing hallucinating by sharing "I have hallucinations and they are bad." Pt could not describe the hallucinations.  How Long Has This Been Causing You Problems? 1-6 months  What  Do You Feel Would Help You the Most Today? Alcohol or Drug Use Treatment; Treatment for Depression or other mood problem; Medication(s)   Have You Recently Had Any Thoughts About Hurting Yourself? Yes  Are You Planning to Commit Suicide/Harm Yourself At This time? Yes   Have you Recently Had Thoughts About Hurting Someone Karolee Ohs? No  Are You Planning to Harm Someone at This Time? No  Explanation: No data recorded  Have You Used Any Alcohol or Drugs in the Past 24 Hours? Yes  How Long Ago Did You Use Drugs or Alcohol? No data recorded What Did You Use and How Much? Percocet   Do You Currently Have a Therapist/Psychiatrist? No  Name of Therapist/Psychiatrist: No data recorded  Have You Been Recently Discharged From Any Office Practice or Programs? No  Explanation of Discharge From Practice/Program: No data recorded    CCA Screening Triage Referral Assessment Type of Contact: No data recorded Telemedicine Service Delivery:   Is this Initial or Reassessment? No data recorded Date Telepsych consult ordered in [facility]:  No data recorded Time  Telepsych consult ordered in [facility]:  No data recorded Location of Assessment: [facility] Assessment Services  Provider Location: [facility] Assessment Services   Collateral Involvement: Pt's son: Joby Richart [phone]   Does Patient Have a Court Appointed Legal Guardian? No data recorded Name and Contact of Legal Guardian: No data recorded If Minor and Not Living with Parent(s), Who has Custody? NA  Is CPS involved or ever been involved? Never  Is APS involved or ever been involved? Never   Patient Determined To Be At Risk for Harm To Self or Others Based on Review of Patient Reported Information or Presenting Complaint? Yes, for Self-Harm  Method: No data recorded Availability of Means: No data recorded Intent: No data recorded Notification Required: No data recorded Additional Information for Danger to Others Potential: No data recorded Additional Comments for Danger to Others Potential: No data recorded Are There Guns or Other Weapons in Your Home? No data recorded Types of Guns/Weapons: No data recorded Are These Weapons Safely Secured?                            No data recorded Who Could Verify You Are Able To Have These Secured: No data recorded Do You Have any Outstanding Charges, Pending Court Dates, Parole/Probation? No data recorded Contacted To Inform of Risk of Harm To Self or Others: Family/Significant Other:    Does Patient Present under Involuntary Commitment? No  IVC Papers Initial File Date: No data recorded  [place] of Residence: [place]   Patient Currently Receiving the Following Services: Not Receiving Services   Determination of Need: Emergent (2 hours)   Options For Referral: Geropsychiatric Facility     CCA Biopsychosocial Patient Reported Schizophrenia/Schizoaffective Diagnosis in Past: No   Strengths: Pt is motivated for treatment, clearly articulates his thoughts and feelings   Mental Health Symptoms Depression:   Change in  energy/activity; Difficulty Concentrating; Fatigue; Hopelessness; Increase/decrease in appetite; Irritability   Duration of Depressive symptoms:  Duration of Depressive Symptoms: Greater than two weeks   Mania:   None   Anxiety:    Worrying; Tension; Restlessness; Irritability; Fatigue; Difficulty concentrating   Psychosis:   Hallucinations   Duration of Psychotic symptoms:  Duration of Psychotic Symptoms: Less than six months   Trauma:   None   Obsessions:   None   Compulsions:   None  Inattention:   N/A   Hyperactivity/Impulsivity:   N/A   Oppositional/Defiant Behaviors:   N/A   Emotional Irregularity:   None   Other Mood/Personality Symptoms:   NA    Mental Status Exam Appearance and self-care  Stature:   Average   Weight:   Average weight   Clothing:   Casual   Grooming:   Normal   Cosmetic use:   None   Posture/gait:   Normal   Motor activity:   Not Remarkable   Sensorium  Attention:   Distractible   Concentration:   Anxiety interferes   Orientation:   X5   Recall/memory:   Normal   Affect and Mood  Affect:   Anxious; Depressed   Mood:   Anxious; Depressed   Relating  Eye contact:   Normal   Facial expression:   Anxious; Depressed; Responsive   Attitude toward examiner:   Cooperative   Thought and Language  Speech flow:  Normal   Thought content:   Appropriate to Mood and Circumstances   Preoccupation:   None   Hallucinations:   Auditory; Visual   Organization:  No data recorded  [employer] of Knowledge:   Average   Intelligence:   Average   Abstraction:   Normal   Judgement:   Fair   Dance movement psychotherapist:   Adequate   Insight:   Gaps   Decision Making:   Normal   Social Functioning  Social Maturity:   Responsible   Social Judgement:   Normal   Stress  Stressors:   Illness   Coping Ability:   Exhausted; Overwhelmed   Skill Deficits:   None   Supports:    Family; Friends/Service system     Religion: Religion/Spirituality Are You A Religious Person?: Yes What is Your Religious Affiliation?: Christian How Might This Affect Treatment?: NA  Leisure/Recreation: Leisure / Recreation Do You Have Hobbies?: Yes Leisure and Hobbies: watching television  Exercise/Diet: Exercise/Diet Do You Exercise?: No Have You Gained or Lost A Significant Amount of Weight in the Past Six Months?: No Do You Follow a Special Diet?: No Do You Have Any Trouble Sleeping?: No   CCA Employment/Education Employment/Work Situation: Employment / Work Academic librarian Situation: Retired Passenger transport manager has Been Impacted by Current Illness: No Has Patient ever Been in Equities trader?: Yes (Describe in comment) (NAVY) Did You Receive Any Psychiatric Treatment/Services While in the Military?: No  Education: Education Is Patient Currently Attending School?: No   CCA Family/Childhood History Family and Relationship History: Family history Marital status: Widowed Widowed, when?: 12/16/2020 Does patient have children?: Yes How many children?: 2 How is patient's relationship with their children?: Son and daughter do not live in the area  Childhood History:  Childhood History By whom was/is the patient raised?: Both parents Did patient suffer any verbal/emotional/physical/sexual abuse as a child?: No Did patient suffer from severe childhood neglect?: No Has patient ever been sexually abused/assaulted/raped as an adolescent or adult?: No Was the patient ever a victim of a crime or a disaster?: No Witnessed domestic violence?: No Has patient been affected by domestic violence as an adult?: No  Child/Adolescent Assessment:     CCA Substance Use Alcohol/Drug Use: Alcohol / Drug Use Pain Medications: Pt reports abusing pain medications Prescriptions: See MAR Over the Counter: Pt reports using non-prescription pain medications including  acetaminophen History of alcohol / drug use?: Yes Longest period of sobriety (when/how long): Unknown Withdrawal Symptoms: Fever / Chills, Cramps, DTs, Weakness, Tremors  Substance #1 Name of Substance 1: Narcotic pain medications 1 - Age of First Use: unknown 1 - Amount (size/oz): varies 1 - Frequency: Daily 1 - Duration: Ongoing 1 - Last Use / Amount: 12/26/2021 1 - Method of Aquiring: Prescription 1- Route of Use: Oral ingestion                       ASAM's:  Six Dimensions of Multidimensional Assessment  Dimension 1:  Acute Intoxication and/or Withdrawal Potential:   Dimension 1:  Description of individual's past and current experiences of substance use and withdrawal: Pt reports experiencing withdrawal when he does not have enough pain medication  Dimension 2:  Biomedical Conditions and Complications:   Dimension 2:  Description of patient's biomedical conditions and  complications: Arthritis  Dimension 3:  Emotional, Behavioral, or Cognitive Conditions and Complications:  Dimension 3:  Description of emotional, behavioral, or cognitive conditions and complications: Pt reports symptoms of depression, anxiety and suicidal ideation  Dimension 4:  Readiness to Change:  Dimension 4:  Description of Readiness to Change criteria: Pt wants medications managed and pain controlled  Dimension 5:  Relapse, Continued use, or Continued Problem Potential:  Dimension 5:  Relapse, continued use, or continued problem potential critiera description: Pt does not want to be in pain  Dimension 6:  Recovery/Living Environment:  Dimension 6:  Recovery/Iiving environment criteria description: Pt lives alone  ASAM Severity Score: ASAM's Severity Rating Score: 10  ASAM Recommended Level of Treatment: ASAM Recommended Level of Treatment: Level III Residential Treatment   Substance use Disorder (SUD) Substance Use Disorder (SUD)  Checklist Symptoms of Substance Use: Continued use despite having a  persistent/recurrent physical/psychological problem caused/exacerbated by use, Continued use despite persistent or recurrent social, interpersonal problems, caused or exacerbated by use, Evidence of tolerance, Evidence of withdrawal (Comment), Large amounts of time spent to obtain, use or recover from the substance(s), Persistent desire or unsuccessful efforts to cut down or control use, Presence of craving or strong urge to use, Social, occupational, recreational activities given up or reduced due to use, Substance(s) often taken in larger amounts or over longer times than was intended  Recommendations for Services/Supports/Treatments: Recommendations for Services/Supports/Treatments Recommendations For Services/Supports/Treatments: Inpatient Hospitalization  Discharge Disposition: Discharge Disposition Medical Exam completed: Yes Disposition of Patient: Movement to [facility] or [facility] ED  DSM5 Diagnoses: There are no problems to display for this patient.    Referrals to Alternative Service(s): Referred to Alternative Service(s):   Place:   Date:   Time:    Referred to Alternative Service(s):   Place:   Date:   Time:    Referred to Alternative Service(s):   Place:   Date:   Time:    Referred to Alternative Service(s):   Place:   Date:   Time:     Pamalee Leyden, [facility]

## 2021-12-27 NOTE — ED Triage Notes (Signed)
Patient arrived with [facility] from [facility] reports intentional overdose on Hydrocodone tabs approx. 1/2 bottle today , denies SI , respirations unlabored/alert and oriented , denies emesis .

## 2021-12-27 NOTE — ED Notes (Signed)
EMS called for pt transport to [facility]

## 2021-12-28 ENCOUNTER — Encounter (HOSPITAL_COMMUNITY): Payer: Self-pay | Admitting: Emergency Medicine

## 2021-12-28 ENCOUNTER — Inpatient Hospital Stay: Admission: RE | Admit: 2021-12-28 | Payer: Medicare HMO | Source: Intra-hospital | Admitting: Psychiatry

## 2021-12-28 LAB — URINALYSIS, COMPLETE (UACMP) WITH MICROSCOPIC
Bacteria, UA: NONE SEEN
Bilirubin Urine: NEGATIVE
Glucose, UA: NEGATIVE mg/dL
Hgb urine dipstick: NEGATIVE
Ketones, ur: NEGATIVE mg/dL
Leukocytes,Ua: NEGATIVE
Nitrite: NEGATIVE
Protein, ur: NEGATIVE mg/dL
Specific Gravity, Urine: 1.017 (ref 1.005–1.030)
pH: 5 (ref 5.0–8.0)

## 2021-12-28 LAB — ETHANOL: Alcohol, Ethyl (B): <10 mg/dL (ref <10)

## 2021-12-28 LAB — ACETAMINOPHEN LEVEL: Acetaminophen (Tylenol), Serum: 10 ug/mL (ref 10–30)

## 2021-12-28 LAB — SALICYLATE LEVEL: Salicylate Lvl: <7 mg/dL — ABNORMAL LOW (ref 7.0–30.0)

## 2021-12-28 MED ORDER — TRAMADOL HCL 50 MG PO TABS
50.0000 mg | ORAL_TABLET | Freq: Once | ORAL | Status: AC
  Administered 2021-12-28: 50 mg via ORAL
  Filled 2021-12-28: qty 1

## 2021-12-28 MED ORDER — ACETAMINOPHEN 325 MG PO TABS
650.0000 mg | ORAL_TABLET | Freq: Four times a day (QID) | ORAL | Status: DC | PRN
  Administered 2021-12-28: 650 mg via ORAL
  Filled 2021-12-28: qty 2

## 2021-12-28 MED ORDER — SODIUM CHLORIDE 0.9 % IV BOLUS
1000.0000 mL | Freq: Once | INTRAVENOUS | Status: AC
  Administered 2021-12-28: 1000 mL via INTRAVENOUS

## 2021-12-28 NOTE — ED Notes (Signed)
Pt resting in bed, on L side, with eyes shut. Chest rise and fall noted.

## 2021-12-28 NOTE — ED Notes (Addendum)
Pt had second phone call with son. Rn got on phone to confirm visiting hours. Son plans to visit tomorrow at 8:30am

## 2021-12-28 NOTE — ED Notes (Signed)
Pt did not have diet order and lunch tray did not come- diet orders placed and cafeteria called to bring tray.

## 2021-12-28 NOTE — ED Notes (Signed)
RN called [facility] to see if there was bed availability and they do not have beds.

## 2021-12-28 NOTE — ED Provider Notes (Signed)
[facility] EMERGENCY DEPARTMENT Provider Note   CSN: [id number] Arrival date & time: 12/27/21  2308     History  Chief Complaint  Patient presents with   Intentional Overdose    [name] Norris is a 81 y.o. male.  HPI     This is an 81 year old male with a history of chronic pain on hydrocodone who presents with overdose.  Patient reports that he took an unknown amount of hydrocodone on Friday night.  When asked specifically why he took this, he states "I just was trying to end it all."  He reports that he has had difficulty coping and intermittent SI since his wife died in 2021-01-03.  He does have access to guns in the home.  He had been living in assisted living but now lives in [place] by himself.  Patient is prescribed hydrocodone.  He is unsure how many he took.  He states that he took a handful but "I still woke up."  He was taken to behavioral health urgent care by police.  Per their assessment, he meets criteria for [facility] psych placement.  Patient denies any physical complaints at this time.  He states that he is hungry.  Home Medications Prior to Admission medications   Medication Sig Start Date End Date Taking? Authorizing Provider  acetaminophen (TYLENOL) 500 MG tablet Take 500 mg by mouth every 6 (six) hours as needed for mild pain.    [provider]  b complex vitamins tablet Take 1 tablet by mouth daily.    [provider]  Calcium-Magnesium-Vitamin D (CALCIUM 500 PO) Take 1 tablet by mouth daily.    [provider]  fluticasone (FLONASE) 50 MCG/ACT nasal spray Place 2 sprays into both nostrils daily. 05/04/15   [provider]  Multiple Vitamins-Minerals (MENS 50+ MULTI VITAMIN/MIN) TABS Take 1 tablet by mouth daily.    [provider]  tamsulosin (FLOMAX) 0.4 MG CAPS capsule Take 1 capsule by mouth daily. At bed time 05/13/15   [provider]  traMADol (ULTRAM) 50 MG tablet Take 2 tablets by  mouth every 8 (eight) hours as needed. 06/21/15   [provider]  vitamin C (ASCORBIC ACID) 500 MG tablet Take 500 mg by mouth daily.    [provider]  VITAMIN D, CHOLECALCIFEROL, PO Take 1 tablet by mouth daily.    [provider]  VITAMIN E PO Take 1 capsule by mouth daily.    [provider]      Allergies    Latex    Review of Systems   Review of Systems  Constitutional:  Negative for fever.  Respiratory:  Negative for shortness of breath.   Cardiovascular:  Negative for chest pain.  Gastrointestinal:  Negative for abdominal pain.  All other systems reviewed and are negative.   Physical Exam Updated Vital Signs BP 131/87 (BP Location: Right Arm)   Pulse 78   Temp 98.1 F (36.7 C) (Oral)   Resp 17   SpO2 98%  Physical Exam Vitals and nursing note reviewed.  Constitutional:      Appearance: He is well-developed. He is not ill-appearing.  HENT:     Head: Normocephalic and atraumatic.     Mouth/Throat:     Mouth: Mucous membranes are dry.  Eyes:     Pupils: Pupils are equal, round, and reactive to light.  Cardiovascular:     Rate and Rhythm: Normal rate and regular rhythm.  Pulmonary:  Effort: Pulmonary effort is normal. No respiratory distress.     Breath sounds: No wheezing.  Abdominal:     Palpations: Abdomen is soft.     Tenderness: There is no abdominal tenderness.  Musculoskeletal:     Cervical back: Neck supple.  Lymphadenopathy:     Cervical: No cervical adenopathy.  Skin:    General: Skin is warm and dry.  Neurological:     Mental Status: He is alert and oriented to person, place, and time.  Psychiatric:     Comments: Sometimes requires redirection during history taking, flat affect, cooperative     ED Results / Procedures / Treatments   Labs (all labs ordered are listed, but only abnormal results are displayed) Labs Reviewed  COMPREHENSIVE METABOLIC PANEL - Abnormal; Notable for the following components:       Result Value   Glucose, Bld 129 (*)    BUN 46 (*)    Creatinine, Ser 2.06 (*)    Total Protein 6.3 (*)    GFR, Estimated 32 (*)    All other components within normal limits  CBC - Abnormal; Notable for the following components:   WBC 12.5 (*)    MCV 101.3 (*)    All other components within normal limits  RAPID URINE DRUG SCREEN, HOSP PERFORMED - Abnormal; Notable for the following components:   Opiates POSITIVE (*)    All other components within normal limits  SALICYLATE LEVEL - Abnormal; Notable for the following components:   Salicylate Lvl <7.0 (*)    All other components within normal limits  ETHANOL  ACETAMINOPHEN LEVEL    EKG None  Radiology No results found.  Procedures Procedures    Medications Ordered in ED Medications  sodium chloride 0.9 % bolus 1,000 mL (has no administration in time range)    ED Course/ Medical Decision Making/ A&P                           Medical Decision Making Amount and/or Complexity of Data Reviewed Labs: ordered.  Risk Decision regarding hospitalization.   This patient presents to the ED for concern of overdose, this involves an extensive number of treatment options, and is a complaint that carries with it a high risk of complications and morbidity.  I considered the following differential and admission for this acute, potentially life threatening condition.  The differential diagnosis includes overdose, hepatic failure  MDM:    This is a 81 year old male who presents following an intentional overdose.  Reports intermittent suicidal ideation.  Reports overdose of unknown amount of hydrocodone greater than 24 hours ago.  He is awake, alert, oriented.  Vital signs are reassuring.  He has no physical complaints and physical exam is fairly benign.  He does appear clinically dry.  Labs reviewed from triage.  Tylenol level is negative and LFTs are normal.  Doubt life-threatening Tylenol ingestion at this time.  He does have a  mild AKI with creatinine of 2.  Baseline 1 month ago was 1.17.  He was given a liter of fluids.  Otherwise his labs are unremarkable.  He has been evaluated by psychiatry and meets criteria for [facility] psych placement.  He is medically cleared.  (Labs, imaging, consults)  Labs: I Ordered, and personally interpreted labs.  The pertinent results include: CBC, CMP, Tylenol and salicylate levels, UDS  Imaging Studies ordered: I ordered imaging studies including none I independently visualized and interpreted imaging. I agree with the  radiologist interpretation  Additional history obtained from chart review.  External records from outside source obtained and reviewed including behavioral health assessment  Cardiac Monitoring: The patient was maintained on a cardiac monitor.  I personally viewed and interpreted the cardiac monitored which showed an underlying rhythm of: Sinus rhythm  Reevaluation: After the interventions noted above, I reevaluated the patient and found that they have :stayed the same  Social Determinants of Health: Elderly, lives independently, suicidal ideation and depression  Disposition: Admit  Co morbidities that complicate the patient evaluation  Past Medical History:  Diagnosis Date   Back pain      Medicines Meds ordered this encounter  Medications   sodium chloride 0.9 % bolus 1,000 mL    I have reviewed the patients home medicines and have made adjustments as needed  Problem List / ED Course: Problem List Items Addressed This Visit   None Visit Diagnoses     Suicidal ideation    -  Primary   Overdose, intentional self-harm, initial encounter ([place])                       Final Clinical Impression(s) / ED Diagnoses Final diagnoses:  Suicidal ideation  Overdose, intentional self-harm, initial encounter [facility])    Rx / DC Orders ED Discharge Orders     None         Ragna Kramlich, Barbette Hair, MD 12/28/21 0131

## 2021-12-28 NOTE — ED Notes (Signed)
Pt valuables given to security, other belongings placed in locker 11

## 2021-12-28 NOTE — Progress Notes (Signed)
Per Vivien Presto, via secure chat, patient meets criteria for inpatient treatment. There are no available beds at [facility] today. CSW faxed referrals to the following facilities for review:   [facility]  Pending - Request Sent N/A [address] [place] [address] [phone] --  [facility]  Pending - Request Sent N/A [address] [place] [address] [phone] --  [facility]  Pending - Request Sent N/A [address] [place] [address] [phone] --  [facility]  Pending - Request Sent N/A [address] [place], [place] [address] [phone] --  [facility]  Pending - Request Sent N/A [address] [place] [address] [phone] --  [facility]  Pending - Request Sent N/A [address], [place] [address] [phone] --  [facility]  Pending - Request Sent N/A [address], [place] [address] [phone] --  [facility]  Pending - Request Sent N/A [address] [place] [address] [phone] --   TTS will continue to seek bed placement.  Crissie Reese, MSW, Lenice Pressman Phone: [phone] Disposition/TOC

## 2021-12-28 NOTE — ED Notes (Signed)
Patient is eating his sandwich. Patient also given two cups of sprite.

## 2021-12-28 NOTE — ED Notes (Signed)
Pt called his son at this time asking to for him to come get him out of here.

## 2021-12-28 NOTE — BH Assessment (Signed)
Patient accepted to [facility] Geriatric Psych Unit, pending Urinalysis & COVID results.  Accepted by Nurse Practitioner Nanine Means, on the behalf of Dr. Toni Amend..  Attending  Physician will be Dr. Toni Amend.  Patient pending bed assignment, by [facility] Charge Nurse.   Call report to [phone].  Representative/Transfer Coordinator is Warden/ranger Patient pre-admitted by [facility])

## 2021-12-28 NOTE — ED Notes (Signed)
Pt moved to new room.

## 2021-12-28 NOTE — ED Notes (Signed)
Reached out again to TTS

## 2021-12-28 NOTE — ED Notes (Addendum)
This RN spoke with son Caralyn Guile to provide update.

## 2021-12-28 NOTE — ED Notes (Signed)
Pt is eating his lunch tray

## 2021-12-28 NOTE — ED Notes (Signed)
Pt given remote and urinal and fresh blankets

## 2021-12-28 NOTE — ED Notes (Signed)
NS reaching out to TTS to ensure pt does not lose place in line d/t transfer. No one answering x3 different numbers provided, secure chat sent as well. No response at this time.

## 2021-12-28 NOTE — ED Notes (Signed)
Pt instructed to provide urine sample.

## 2021-12-29 ENCOUNTER — Other Ambulatory Visit: Payer: Self-pay

## 2021-12-29 ENCOUNTER — Encounter: Payer: Self-pay | Admitting: Psychiatry

## 2021-12-29 ENCOUNTER — Inpatient Hospital Stay
Admission: AD | Admit: 2021-12-29 | Discharge: 2022-01-02 | DRG: 885 | Disposition: A | Discharge status: Home / Self Care | Payer: No Typology Code available for payment source | Source: Intra-hospital | Attending: Psychiatry | Admitting: Psychiatry

## 2021-12-29 DIAGNOSIS — T402X2A Poisoning by other opioids, intentional self-harm, initial encounter: Secondary | ICD-10-CM | POA: Diagnosis not present

## 2021-12-29 DIAGNOSIS — R45851 Suicidal ideations: Secondary | ICD-10-CM | POA: Diagnosis not present

## 2021-12-29 DIAGNOSIS — G8929 Other chronic pain: Secondary | ICD-10-CM | POA: Diagnosis present

## 2021-12-29 DIAGNOSIS — G4700 Insomnia, unspecified: Secondary | ICD-10-CM | POA: Diagnosis present

## 2021-12-29 DIAGNOSIS — Z20822 Contact with and (suspected) exposure to covid-19: Secondary | ICD-10-CM | POA: Diagnosis not present

## 2021-12-29 DIAGNOSIS — Z87891 Personal history of nicotine dependence: Secondary | ICD-10-CM

## 2021-12-29 DIAGNOSIS — M549 Dorsalgia, unspecified: Secondary | ICD-10-CM | POA: Diagnosis present

## 2021-12-29 DIAGNOSIS — Z96659 Presence of unspecified artificial knee joint: Secondary | ICD-10-CM | POA: Diagnosis present

## 2021-12-29 DIAGNOSIS — Z91040 Latex allergy status: Secondary | ICD-10-CM | POA: Diagnosis not present

## 2021-12-29 DIAGNOSIS — Z9151 Personal history of suicidal behavior: Secondary | ICD-10-CM

## 2021-12-29 DIAGNOSIS — F332 Major depressive disorder, recurrent severe without psychotic features: Principal | ICD-10-CM | POA: Diagnosis present

## 2021-12-29 DIAGNOSIS — N179 Acute kidney failure, unspecified: Secondary | ICD-10-CM | POA: Diagnosis not present

## 2021-12-29 DIAGNOSIS — F333 Major depressive disorder, recurrent, severe with psychotic symptoms: Secondary | ICD-10-CM | POA: Diagnosis not present

## 2021-12-29 DIAGNOSIS — Z79899 Other long term (current) drug therapy: Secondary | ICD-10-CM | POA: Diagnosis not present

## 2021-12-29 LAB — SARS CORONAVIRUS 2 BY RT PCR: SARS Coronavirus 2 by RT PCR: NEGATIVE

## 2021-12-29 MED ORDER — TAMSULOSIN HCL 0.4 MG PO CAPS
0.4000 mg | ORAL_CAPSULE | Freq: Every day | ORAL | Status: DC
  Administered 2021-12-30 – 2022-01-02 (×4): 0.4 mg via ORAL
  Filled 2021-12-29 (×4): qty 1

## 2021-12-29 MED ORDER — METHOCARBAMOL 500 MG PO TABS
500.0000 mg | ORAL_TABLET | Freq: Three times a day (TID) | ORAL | Status: DC | PRN
  Administered 2021-12-29 – 2022-01-02 (×7): 500 mg via ORAL
  Filled 2021-12-29 (×8): qty 1

## 2021-12-29 MED ORDER — LOPERAMIDE HCL 2 MG PO CAPS
2.0000 mg | ORAL_CAPSULE | ORAL | Status: DC | PRN
Start: 2021-12-29 — End: 2021-12-29
  Administered 2021-12-29: 2 mg via ORAL
  Filled 2021-12-29: qty 1

## 2021-12-29 MED ORDER — ADULT MULTIVITAMIN W/MINERALS CH
1.0000 | ORAL_TABLET | Freq: Every day | ORAL | Status: DC
  Filled 2021-12-29: qty 1

## 2021-12-29 MED ORDER — ASCORBIC ACID 500 MG PO TABS
500.0000 mg | ORAL_TABLET | Freq: Every day | ORAL | Status: DC
  Administered 2021-12-29 – 2022-01-02 (×5): 500 mg via ORAL
  Filled 2021-12-29 (×5): qty 1

## 2021-12-29 MED ORDER — ONDANSETRON 4 MG PO TBDP
4.0000 mg | ORAL_TABLET | Freq: Four times a day (QID) | ORAL | Status: DC | PRN
  Administered 2021-12-29: 4 mg via ORAL
  Filled 2021-12-29: qty 1

## 2021-12-29 MED ORDER — DICYCLOMINE HCL 20 MG PO TABS
20.0000 mg | ORAL_TABLET | Freq: Four times a day (QID) | ORAL | Status: DC | PRN
  Administered 2021-12-29 – 2022-01-01 (×4): 20 mg via ORAL
  Filled 2021-12-29 (×5): qty 1

## 2021-12-29 MED ORDER — ACETAMINOPHEN 325 MG PO TABS
650.0000 mg | ORAL_TABLET | Freq: Four times a day (QID) | ORAL | Status: AC | PRN
  Administered 2021-12-29 – 2021-12-31 (×6): 650 mg via ORAL
  Filled 2021-12-29 (×7): qty 2

## 2021-12-29 MED ORDER — ADULT MULTIVITAMIN W/MINERALS CH
1.0000 | ORAL_TABLET | Freq: Every day | ORAL | Status: DC
  Administered 2021-12-30 – 2022-01-02 (×4): 1 via ORAL
  Filled 2021-12-29 (×4): qty 1

## 2021-12-29 MED ORDER — SERTRALINE HCL 25 MG PO TABS
25.0000 mg | ORAL_TABLET | Freq: Every day | ORAL | Status: DC
  Administered 2021-12-29: 25 mg via ORAL
  Filled 2021-12-29: qty 1

## 2021-12-29 MED ORDER — LOPERAMIDE HCL 2 MG PO CAPS
2.0000 mg | ORAL_CAPSULE | ORAL | Status: DC | PRN
  Administered 2021-12-29 – 2021-12-30 (×2): 2 mg via ORAL
  Administered 2021-12-30: 4 mg via ORAL
  Administered 2021-12-31 (×2): 2 mg via ORAL
  Administered 2022-01-02: 4 mg via ORAL
  Filled 2021-12-29 (×7): qty 2

## 2021-12-29 MED ORDER — SERTRALINE HCL 25 MG PO TABS
25.0000 mg | ORAL_TABLET | Freq: Every day | ORAL | Status: DC
  Administered 2021-12-30: 25 mg via ORAL
  Filled 2021-12-29: qty 1

## 2021-12-29 MED ORDER — CLONIDINE HCL 0.1 MG PO TABS
0.1000 mg | ORAL_TABLET | Freq: Once | ORAL | Status: AC
  Administered 2021-12-29: 0.1 mg via ORAL
  Filled 2021-12-29: qty 1

## 2021-12-29 NOTE — Progress Notes (Signed)
   12/29/21 2325  Vital Signs  Temp 98.8 F (37.1 C)  Temp Source Oral  Pulse Rate 68  Pulse Rate Source Monitor  Resp 18  BP 128/68  BP Location Right Arm  BP Method Automatic  Patient Position (if appropriate) Lying  Level of Consciousness  Level of Consciousness Alert  Oxygen Therapy  SpO2 99 %  O2 Device Room Air   Patient in bed calm and denies discomfort at present. No nose bleeding v/s WNL. Support and encouragement ongoing.

## 2021-12-29 NOTE — ED Notes (Signed)
Attempted to call report to [facility], the RN there was unavailable to take report at this time, left call back number

## 2021-12-29 NOTE — ED Notes (Signed)
Lunch order placed

## 2021-12-29 NOTE — ED Notes (Signed)
Called [facility] office to request transport, no answer at this time, left VM with call back number

## 2021-12-29 NOTE — Progress Notes (Signed)
   12/29/21 1828 12/29/21 2014  Vital Signs  Temp 98.2 F (36.8 C) 98.9 F (37.2 C)  Temp Source Oral Oral  Pulse Rate (!) 101 (!) 121  Pulse Rate Source Dinamap Monitor  Resp 18 18  BP (!) 149/110 (!) 174/112  BP Location Right Arm Right Arm  BP Method Automatic Automatic  Patient Position (if appropriate) Lying Lying  Level of Consciousness  Level of Consciousness Alert  --   MEWS COLOR  MEWS Score Color Green Yellow  Oxygen Therapy  SpO2 99 % 99 %   Patient c/o diarrhea with abd cramping and nausea, back pain, Provider notifed new orders of COWs protocol and Clonidine, Imodium, Robaxin and Zofran given. Prn medications given. See (Mar).

## 2021-12-29 NOTE — ED Notes (Signed)
Report called to [place] at [facility]

## 2021-12-29 NOTE — ED Notes (Signed)
Son came for visitation & gave this RN a copy of his POA paperwork. This will be given to Medical Records.

## 2021-12-29 NOTE — ED Notes (Signed)
Pt will not consent to transfer to [facility], EDP & Charge RN has been made aware he will require IVC to get him there.

## 2021-12-29 NOTE — Progress Notes (Addendum)
Patient presents IVC after an overdose on hydrocodone. Patient states he was "stressed about everything". This started when his wife passed away in 12-22-20. Patient says he has supportive children and his child is here visiting from [place]. Patient currently lives alone. Patient is calm and cooperative with assessment. He currently denies SI, HI, and AVH. He endorses some diarrhea related to be taken off hydrocodone medication he was taking for chronic back pain. Patient also endorses 9/10 back pain at time of admission. Patient is unsteady on his feet and states he fell and hit his arm within the last 10 days. Patient given front wheel walker to use on the unit. Patient also says he uses hearing aids, but left them at home.  Patient oriented to unit. Dinner given. Patient remains safe on the unit at this time.

## 2021-12-29 NOTE — ED Notes (Signed)
When officers arrived to transport pt, pt stated "oh good, please give me just a minute" pt then combed his hair and stated "okay now I'm ready" Pts son was notified that pt is being transferred and stated he would go visit him tomorrow at [facility]

## 2021-12-29 NOTE — ED Notes (Signed)
Sherrif's office called back and gave transport time ETA of 5:30pm Notified MD about need for EMTALA documentation

## 2021-12-29 NOTE — ED Notes (Signed)
Valuables in sealed envelope, belongings, and all required paperwork sent with [facility], officer [employer]

## 2021-12-29 NOTE — ED Notes (Signed)
Faxed IVC paperwork to [facility] geri psych per request

## 2021-12-29 NOTE — Tx Team (Signed)
Initial Treatment Plan 12/29/2021 7:04 PM [name] Norris OMB:[id number]   PATIENT STRESSORS: Loss of wife  PATIENT STRENGTHS: Capable of independent living  Communication skills  General fund of knowledge    PATIENT IDENTIFIED PROBLEMS: Loss of wife                     DISCHARGE CRITERIA:  Improved stabilization in mood, thinking, and/or behavior Motivation to continue treatment in a less acute level of care  PRELIMINARY DISCHARGE PLAN: Outpatient therapy Return to previous living arrangement  PATIENT/FAMILY INVOLVEMENT: This treatment plan has been presented to and reviewed with the patient, [name] Norris.  The patient and family have been given the opportunity to ask questions and make suggestions.  Celene Kras, RN 12/29/2021, 7:04 PM

## 2021-12-29 NOTE — Progress Notes (Signed)
   12/29/21 2218  Vital Signs  Temp 98.7 F (37.1 C)  Temp Source Oral  Pulse Rate (!) 109  Pulse Rate Source Monitor  Resp 18  BP (!) 151/98  BP Location Right Arm  BP Method Automatic  Patient Position (if appropriate) Lying  MEWS COLOR  MEWS Score Color Green  MEWS Score  MEWS Temp 0  MEWS Systolic 0  MEWS Pulse 1  MEWS RR 0  MEWS LOC 0  MEWS Score 1   Patient had a small amount of nose bleed, ice applied Patient reassured Nurse stayed with the Patient V/S Checked had improved from the previous numbers. Clonidine was given Per [facility] Provider orders. Patient was reassured. Support and encouragement provided.

## 2021-12-29 NOTE — ED Provider Notes (Addendum)
Emergency Medicine Observation Re-evaluation Note  [name] Norris is a 81 y.o. male, seen on rounds today.  Pt initially presented to the ED for complaints of Intentional Overdose Currently, the patient is stable.  Physical Exam  BP (!) 175/82 (BP Location: Right Arm)   Pulse 70   Temp 98.2 F (36.8 C) (Oral)   Resp 18   SpO2 98%  Physical Exam General: NAD Cardiac: well perfused Lungs: even and unlabored Psych: no agitation  ED Course / MDM   I have reviewed the labs performed to date as well as medications administered while in observation.  Recent changes in the last 24 hours include admission to geri-psych.  Plan  Current plan is for admission to geripsych.  [name] Norris is not under involuntary commitment.  11 AM Informed by nursing staff that the patient is no longer voluntarily willing to present to [facility].  IVC paperwork filled out due to need for [facility] psych admission in the setting of intentional overdose.     Ernie Avena, MD 12/29/21 7034    Ernie Avena, MD 12/29/21 1143

## 2021-12-30 DIAGNOSIS — F332 Major depressive disorder, recurrent severe without psychotic features: Secondary | ICD-10-CM | POA: Diagnosis not present

## 2021-12-30 MED ORDER — DULOXETINE HCL 30 MG PO CPEP
30.0000 mg | ORAL_CAPSULE | Freq: Every day | ORAL | Status: DC
  Administered 2021-12-30 – 2022-01-02 (×4): 30 mg via ORAL
  Filled 2021-12-30 (×4): qty 1

## 2021-12-30 MED ORDER — DOXEPIN HCL 10 MG PO CAPS
10.0000 mg | ORAL_CAPSULE | Freq: Every day | ORAL | Status: DC
  Administered 2021-12-30: 10 mg via ORAL
  Filled 2021-12-30: qty 1

## 2021-12-30 MED ORDER — GABAPENTIN 100 MG PO CAPS
100.0000 mg | ORAL_CAPSULE | Freq: Three times a day (TID) | ORAL | Status: DC
  Administered 2021-12-30 – 2021-12-31 (×4): 100 mg via ORAL
  Filled 2021-12-30 (×4): qty 1

## 2021-12-30 MED ORDER — CLONIDINE HCL 0.1 MG PO TABS
0.1000 mg | ORAL_TABLET | Freq: Four times a day (QID) | ORAL | Status: DC | PRN
  Filled 2021-12-30: qty 1

## 2021-12-30 NOTE — [facility] Suicide Risk Assessment (Signed)
[facility] INPATIENT:  Family/Significant Other Suicide Prevention Education  Suicide Prevention Education:   SPE completed with pt, as pt refused to consent to family contact. SPI pamphlet provided to pt and pt was encouraged to share information with support network, ask questions, and talk about any concerns relating to SPE. Pt denies access to guns/firearms and verbalized understanding of information provided. Mobile Crisis information also provided to pt.   Patient Refusal for Family/Significant Other Suicide Prevention Education: The patient [name] Norris has refused to provide written consent for family/significant other to be provided Family/Significant Other Suicide Prevention Education during admission and/or prior to discharge.  Physician notified.  Patient was provided education on locking up his weapon as proper safety measure. He stated he would never use weapon on anyone or himself and "it's been sitting there, and haven't used it all this time" CSW informed pt to talk with his son about removing weapon from home and provided education.   [name] 12/30/2021, 4:31 PM

## 2021-12-30 NOTE — Plan of Care (Signed)
  Problem: Education: Goal: Knowledge of General Education information will improve Description: Including pain rating scale, medication(s)/side effects and non-pharmacologic comfort measures Outcome: Progressing   Problem: Health Behavior/Discharge Planning: Goal: Ability to manage health-related needs will improve Outcome: Progressing   Problem: Clinical Measurements: Goal: Ability to maintain clinical measurements within normal limits will improve Outcome: Progressing   Problem: Coping: Goal: Level of anxiety will decrease Outcome: Progressing  Patient compliant with medications though isolative to his room. Has sad and flat affect. Support and encouragement provided. No adverse drug noted. Patient ambulating with front wheel walker all fall protocol maintained.

## 2021-12-30 NOTE — [facility] Suicide Risk Assessment (Signed)
[facility] Admission Suicide Risk Assessment   Nursing information obtained from:  Patient Demographic factors:  Male, Age 81, Living alone Current Mental Status:  NA Loss Factors:  Loss of significant relationship Historical Factors:  NA Risk Reduction Factors:  NA  Total Time spent with patient: 1 hour Principal Problem: MDD (major depressive disorder), recurrent episode, severe (HCC) Diagnosis:  Principal Problem:   MDD (major depressive disorder), recurrent episode, severe (HCC)  Subjective Data: Pt is an 81 year old widowed male, Rayshun "Callie Bunyard, who presents unaccompanied to [facility] via Patent examiner. Pt states "I took a half of bottle; well a handful and I still woke up." Pt shares at 7pm on 12/26/21 he was in pain 10/10 so he decided to keep taking his pain medication that he is prescribed. He says his neighbors found him. They encouraged Pt to contact his children and to call 911 for help.   Pt shares that the [place] will no longer prescribe him opioids because he keeps overdosing. Pt states that he must make it till next month so that his other doctor can prescribe him another bottle of pain pills. 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 I just want to die so this circle can be over with."  Pt then goes on to share "I do not want to live like this, and I know I am going to be dead in 82 month." Pt reports he attempted suicide twice last year, once by intentional overdose and one by putting a plastic bag over his head. Pt reports he has a gun and he has considered shooting himself. He denies current homicidal ideation. Pt reports he is experiencing auditory and visual hallucinations that he cannot describe. During assessment, Pt appears at times to be distracted by internal stimuli, staring at different areas of the room and acknowledging he is seeing things. He also says his skin itches. Pt denies use of alcohol or  illicit drugs.   Pt identifies his chronic pain and associated use of pain medications as his primary stressor. He states, "I get clean but then I am forced to do it all over again because I am in so much pain." Pt acknowledges that he has a problem but reports that he does not know what to do anymore. Pt states that he is a widow and that his wife passed away 12/29/20. Pt reports that he and his wife was married for 59 years. Pt reports that now he lives alone but states he did live in an ALF with his wife in [place], but the ALF would make him wait to take pain medication and he reports that he cannot wait. Pt states that his teeth are falling out because of the pain medication. He says there are numerous things that upset him, such as problems in the church, and other concerns. He says he still drives his car. He has a cleaning staff that helps maintain his house. Pt states that he does have children and grandchildren but reports that they live in [place] and [place].  Continued Clinical Symptoms:  Alcohol Use Disorder Identification Test Final Score (AUDIT): 0 The "Alcohol Use Disorders Identification Test", Guidelines for Use in Primary Care, Second Edition.  World Science writer [facility]). Score between 0-7:  no or low risk or alcohol related problems. Score between 8-15:  moderate risk of alcohol related problems. Score between 16-19:  high risk of alcohol related problems. Score 20 or above:  warrants further diagnostic evaluation for alcohol dependence and treatment.   CLINICAL FACTORS:   Depression:   Hopelessness Impulsivity   Musculoskeletal: Strength & Muscle Tone: within normal limits Gait & Station: normal Patient leans: N/A  Psychiatric Specialty Exam:  Presentation  General Appearance: Casual  Eye Contact:Good  Speech:Clear and Coherent  Speech Volume:Normal  Handedness:Right   Mood and Affect  Mood:Depressed  Affect:Flat   Thought Process   Thought Processes:Linear  Descriptions of Associations:Intact  Orientation:Full (Time, Place and Person)  Thought Content:Perseveration  History of Schizophrenia/Schizoaffective disorder:No  Duration of Psychotic Symptoms:Less than six months  Hallucinations:No data recorded Ideas of Reference:Delusions  Suicidal Thoughts:No data recorded Homicidal Thoughts:No data recorded  Sensorium  Memory:Recent Fair; Remote Fair; Immediate Fair  Judgment:Poor  Insight:Poor   Executive Functions  Concentration:Fair  Attention Span:Fair  Recall:Fair  Fund of Knowledge:Good  Language:Good   Psychomotor Activity  Psychomotor Activity:No data recorded  Assets  Assets:Communication Skills; Housing; Health and safety inspector; Physical Health   Sleep  Sleep:No data recorded   Physical Exam: Physical Exam Vitals and nursing note reviewed.  Constitutional:      Appearance: Normal appearance. He is normal weight.  HENT:     Head: Normocephalic and atraumatic.     Nose: Nose normal.     Mouth/Throat:     Pharynx: Oropharynx is clear.  Eyes:     Extraocular Movements: Extraocular movements intact.     Pupils: Pupils are equal, round, and reactive to light.  Cardiovascular:     Rate and Rhythm: Normal rate and regular rhythm.     Pulses: Normal pulses.     Heart sounds: Normal heart sounds.  Pulmonary:     Effort: Pulmonary effort is normal.     Breath sounds: Normal breath sounds.  Abdominal:     General: Abdomen is flat. Bowel sounds are normal.     Palpations: Abdomen is soft.  Musculoskeletal:        General: Normal range of motion.     Cervical back: Normal range of motion and neck supple.  Skin:    General: Skin is warm and dry.  Neurological:     General: No focal deficit present.     Mental Status: He is alert and oriented to person, place, and time.  Psychiatric:        Attention and Perception: Attention and perception normal.        Mood and  Affect: Mood is depressed. Affect is flat.        Speech: Speech normal.        Behavior: Behavior normal. Behavior is cooperative.        Thought Content: Thought content includes suicidal ideation.        Cognition and Memory: Cognition and memory normal.        Judgment: Judgment is impulsive.    Review of Systems  Constitutional: Negative.   HENT: Negative.    Eyes: Negative.   Respiratory: Negative.    Cardiovascular: Negative.   Gastrointestinal: Negative.   Genitourinary: Negative.   Musculoskeletal: Negative.   Skin: Negative.   Neurological: Negative.   Endo/Heme/Allergies: Negative.   Psychiatric/Behavioral:  Positive for depression and suicidal ideas.    Blood pressure 128/68, pulse 68, temperature 98.8 F (37.1 C), temperature source Oral, resp. rate 18, height 5\' 10"  (1.778 m), weight 74.8 kg, SpO2 99 %. Body mass index is 23.68 kg/m.   COGNITIVE FEATURES THAT CONTRIBUTE TO  RISK:  None    SUICIDE RISK:   Mild:  Suicidal ideation of limited frequency, intensity, duration, and specificity.  There are no identifiable plans, no associated intent, mild dysphoria and related symptoms, good self-control (both objective and subjective assessment), few other risk factors, and identifiable protective factors, including available and accessible social support.  PLAN OF CARE: See orders  I certify that inpatient services furnished can reasonably be expected to improve the patient's condition.   Sarina Ill, DO 12/30/2021, 10:32 AM

## 2021-12-30 NOTE — Group Note (Signed)
LCSW Group Therapy Note   Group Date: 12/30/2021 Start Time: 1430 End Time: 1535   Due to the acuity and complex discharge plans, group was not held. Patient was provided therapeutic worksheets and asked to meet with CSW as needed.    Sameen Leas A [place], LCSWA 12/30/2021  3:48 PM

## 2021-12-30 NOTE — H&P (Signed)
Psychiatric Admission Assessment Adult  Patient Identification: [name] Norris MRN:  [id number] Date of Evaluation:  12/30/2021 Chief Complaint:  MDD (major depressive disorder), recurrent episode, severe ([place]) [F33.2] Principal Diagnosis: MDD (major depressive disorder), recurrent episode, severe ([place]) Diagnosis:  Principal Problem:   MDD (major depressive disorder), recurrent episode, severe ([place])  History of Present Illness: [name] Norris is a 81 year old white male who presents on involuntary commitment status post overdose on opioids.  He does not have any psychiatric history.  While in the emergency room at [facility] he was started on Zoloft 25 mg/day.  He has never been in inpatient psychiatry or outpatient psychiatry.  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  He does have a primary care physician, Dr. Sheryn Bison at [facility] in [place].  He has been treated in the past by the [place].  He currently lives alone and has a son in [place] and a daughter in [place].  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  His blood pressure is high but he states that he does not have a history of hypertension and it may be due to his pain.  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  He was being treated at [facility] in [place] for his back pain.  He does not have any past suicide attempts and this was an impulsive overdose.  He does admit to being depressed and denies any auditory or visual hallucinations.  He is able to contract for safety in the hospital.   PER INITIAL INTAKE: Pt is an 81 year old widowed male, [name], who presents unaccompanied to [facility] via Event organiser. Pt states "I took a half of bottle; well a handful and I still woke up." Pt shares at 7pm on 12/26/21 he was in pain 10/10  so he decided to keep taking his pain medication that he is prescribed. He says his neighbors found him. They encouraged Pt to contact his children and to call 911 for help.   Pt shares that the [place] will no longer prescribe him opioids because he keeps overdosing. Pt states that he must make it till next month so that his other doctor can prescribe him another bottle of pain pills. 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 I just want to die so this circle can be over with."  Pt then goes on to share "I do not want to live like this, and I know I am going to be dead in a month." Pt reports he attempted suicide twice last year, once by intentional overdose and one by putting a plastic bag over his head. Pt reports he has a gun and he has considered shooting himself. He denies current homicidal ideation. Pt reports he is experiencing auditory and visual hallucinations that he cannot describe. During assessment, Pt appears at times to be distracted by internal stimuli, staring at different areas of the room and acknowledging he is seeing things. He also says his skin itches. Pt denies use of alcohol or illicit drugs.   Pt identifies his chronic pain and associated use of pain medications as his primary stressor. He states, "I get clean but then I am forced to do it all  over again because I am in so much pain." Pt acknowledges that he has a problem but reports that he does not know what to do anymore. Pt states that he is a widow and that his wife passed away 01-05-2021. Pt reports that he and his wife was married for 59 years. Pt reports that now he lives alone but states he did live in an ALF with his wife in [place], but the ALF would make him wait to take pain medication and he reports that he cannot wait. Pt states that his teeth are falling out because of the pain medication. He says there are numerous things that upset him, such as problems  in the church, and other concerns. He says he still drives his car. He has a cleaning staff that helps maintain his house. Pt states that he does have children and grandchildren but reports that they live in [place] and [place].  Associated Signs/Symptoms: Depression Symptoms:  depressed mood, anhedonia, hopelessness, suicidal attempt, Duration of Depression Symptoms: Greater than two weeks  (Hypo) Manic Symptoms:  Impulsivity, Anxiety Symptoms:  Excessive Worry, Psychotic Symptoms:   None PTSD Symptoms: NA Total Time spent with patient: 1 hour  Past Psychiatric History: None  Is the patient at risk to self? Yes.    Has the patient been a risk to self in the past 6 months? Yes.    Has the patient been a risk to self within the distant past? Yes.    Is the patient a risk to others? No.  Has the patient been a risk to others in the past 6 months? No.  Has the patient been a risk to others within the distant past? No.   Prior Inpatient Therapy:   Prior Outpatient Therapy:    Alcohol Screening: 1. How often do you have a drink containing alcohol?: Never 2. How many drinks containing alcohol do you have on a typical day when you are drinking?: 1 or 2 3. How often do you have six or more drinks on one occasion?: Never AUDIT-C Score: 0 4. How often during the last year have you found that you were not able to stop drinking once you had started?: Never 5. How often during the last year have you failed to do what was normally expected from you because of drinking?: Never 6. How often during the last year have you needed a first drink in the morning to get yourself going after a heavy drinking session?: Never 7. How often during the last year have you had a feeling of guilt of remorse after drinking?: Never 8. How often during the last year have you been unable to remember what happened the night before because you had been drinking?: Never 9. Have you or someone else been  injured as a result of your drinking?: No 10. Has a relative or friend or a doctor or another health worker been concerned about your drinking or suggested you cut down?: No Alcohol Use Disorder Identification Test Final Score (AUDIT): 0 Substance Abuse History in the last 12 months:  No. Consequences of Substance Abuse: NA Previous Psychotropic Medications: No  Psychological Evaluations: No  Past Medical History:  Past Medical History:  Diagnosis Date   Back pain    History reviewed. No pertinent surgical history. Family History: History reviewed. No pertinent family history. Family Psychiatric  History: None Tobacco Screening:   Social History:  Social History   Substance and Sexual Activity  Alcohol Use No  Social History   Substance and Sexual Activity  Drug Use No    Additional Social History:                           Allergies:   Allergies  Allergen Reactions   Latex     Skin breaks out   Lab Results:  Results for orders placed or performed during the hospital encounter of 12/27/21 (from the past 48 hour(s))  Urinalysis, Complete w Microscopic Urine, Clean Catch     Status: None   Collection Time: 12/28/21  6:25 PM  Result Value Ref Range   Color, Urine YELLOW YELLOW   APPearance CLEAR CLEAR   Specific Gravity, Urine 1.017 1.005 - 1.030   pH 5.0 5.0 - 8.0   Glucose, UA NEGATIVE NEGATIVE mg/dL   Hgb urine dipstick NEGATIVE NEGATIVE   Bilirubin Urine NEGATIVE NEGATIVE   Ketones, ur NEGATIVE NEGATIVE mg/dL   Protein, ur NEGATIVE NEGATIVE mg/dL   Nitrite NEGATIVE NEGATIVE   Leukocytes,Ua NEGATIVE NEGATIVE   RBC / HPF 0-5 0 - 5 RBC/hpf   WBC, UA 0-5 0 - 5 WBC/hpf   Bacteria, UA NONE SEEN NONE SEEN   Squamous Epithelial / LPF 0-5 0 - 5   Mucus PRESENT     Comment: Performed at [facility], [place] [address], [place], [place] [address]  SARS Coronavirus 2 by RT PCR (hospital order, performed in [facility] hospital lab) *cepheid single  result test* Anterior Nasal Swab     Status: None   Collection Time: 12/29/21  2:50 AM   Specimen: Anterior Nasal Swab  Result Value Ref Range   SARS Coronavirus 2 by RT PCR NEGATIVE NEGATIVE    Comment: (NOTE) SARS-CoV-2 target nucleic acids are NOT DETECTED.  The SARS-CoV-2 RNA is generally detectable in upper and lower respiratory specimens during the acute phase of infection. The lowest concentration of SARS-CoV-2 viral copies this assay can detect is 250 copies / mL. A negative result does not preclude SARS-CoV-2 infection and should not be used as the sole basis for treatment or other patient management decisions.  A negative result may occur with improper specimen collection / handling, submission of specimen other than nasopharyngeal swab, presence of viral mutation(s) within the areas targeted by this assay, and inadequate number of viral copies (<250 copies / mL). A negative result must be combined with clinical observations, patient history, and epidemiological information.  Fact Sheet for Patients:   https://www.patel.info/  Fact Sheet for Healthcare Providers: https://hall.com/  This test is not yet approved or  cleared by the [place] FDA and has been authorized for detection and/or diagnosis of SARS-CoV-2 by FDA under an Emergency Use Authorization (EUA).  This EUA will remain in effect (meaning this test can be used) for the duration of the COVID-19 declaration under Section 564(b)(1) of the Act, 21 U.S.C. section 360bbb-3(b)(1), unless the authorization is terminated or revoked sooner.  Performed at [facility], [place] [address], [place], [place] [address]     Blood Alcohol level:  Lab Results  Component Value Date   ETH <10 12/27/2021   ETH <10 [id number]    Metabolic Disorder Labs:  No results found for: "HGBA1C", "MPG" No results found for: "PROLACTIN" No results found for: "CHOL", "TRIG", "HDL",  "CHOLHDL", "VLDL", "LDLCALC"  Current Medications: Current Facility-Administered Medications  Medication Dose Route Frequency Provider Last Rate Last Admin   acetaminophen (TYLENOL) tablet 650 mg  650 mg Oral Q6H PRN Chales Abrahams, NP   650 mg at 12/30/21 2706   ascorbic acid (VITAMIN C) tablet 500 mg  500 mg Oral Daily Ophelia Shoulder E, NP   500 mg at 12/30/21 2376   dicyclomine (BENTYL) tablet 20 mg  20 mg Oral Q6H PRN Nira Conn A, NP   20 mg at 12/29/21 2225   doxepin (SINEQUAN) capsule 10 mg  10 mg Oral QHS Sarina Ill, DO       DULoxetine (CYMBALTA) DR capsule 30 mg  30 mg Oral Daily Sarina Ill, DO       gabapentin (NEURONTIN) capsule 100 mg  100 mg Oral TID Sarina Ill, DO       loperamide (IMODIUM) capsule 2-4 mg  2-4 mg Oral PRN Nira Conn A, NP   2 mg at 12/29/21 2224   methocarbamol (ROBAXIN) tablet 500 mg  500 mg Oral Q8H PRN Nira Conn A, NP   500 mg at 12/29/21 2204   multivitamin with minerals tablet 1 tablet  1 tablet Oral Daily Martyn Malay, RPH   1 tablet at 12/30/21 0909   ondansetron (ZOFRAN-ODT) disintegrating tablet 4 mg  4 mg Oral Q6H PRN Nira Conn A, NP   4 mg at 12/29/21 2203   tamsulosin (FLOMAX) capsule 0.4 mg  0.4 mg Oral Daily Ophelia Shoulder E, NP   0.4 mg at 12/30/21 0909   PTA Medications: Medications Prior to Admission  Medication Sig Dispense Refill Last Dose   acetaminophen (TYLENOL) 500 MG tablet Take 500 mg by mouth every 6 (six) hours as needed for mild pain.      b complex vitamins tablet Take 1 tablet by mouth daily.      Calcium-Magnesium-Vitamin D (CALCIUM 500 PO) Take 1 tablet by mouth daily.      fluticasone (FLONASE) 50 MCG/ACT nasal spray Place 2 sprays into both nostrils daily.  5    Multiple Vitamins-Minerals (MENS 50+ MULTI VITAMIN/MIN) TABS Take 1 tablet by mouth daily.      tamsulosin (FLOMAX) 0.4 MG CAPS capsule Take 1 capsule by mouth daily. At bed time      traMADol (ULTRAM) 50 MG tablet  Take 2 tablets by mouth every 8 (eight) hours as needed.      vitamin C (ASCORBIC ACID) 500 MG tablet Take 500 mg by mouth daily.      VITAMIN D, CHOLECALCIFEROL, PO Take 1 tablet by mouth daily.      VITAMIN E PO Take 1 capsule by mouth daily.       Musculoskeletal: Strength & Muscle Tone: within normal limits Gait & Station: normal Patient leans: N/A            Psychiatric Specialty Exam:  Presentation  General Appearance: Casual  Eye Contact:Good  Speech:Clear and Coherent  Speech Volume:Normal  Handedness:Right   Mood and Affect  Mood:Depressed  Affect:Flat   Thought Process  Thought Processes:Linear  Duration of Psychotic Symptoms: Less than six months  Past Diagnosis of Schizophrenia or Psychoactive disorder: No  Descriptions of Associations:Intact  Orientation:Full (Time, Place and Person)  Thought Content:Perseveration  Hallucinations:No data recorded Ideas of Reference:Delusions  Suicidal Thoughts:No data recorded Homicidal Thoughts:No data recorded  Sensorium  Memory:Recent Fair; Remote Fair; Immediate Fair  Judgment:Poor  Insight:Poor   Executive Functions  Concentration:Fair  Attention Span:Fair  Recall:Fair  Fund of Knowledge:Good  Language:Good   Psychomotor Activity  Psychomotor Activity:No data recorded  Assets  Assets:Communication Skills; Housing; Health and safety inspector;  Physical Health   Sleep  Sleep:No data recorded   Physical Exam: Physical Exam Vitals and nursing note reviewed.  Constitutional:      Appearance: Normal appearance. He is normal weight.  HENT:     Head: Normocephalic and atraumatic.     Nose: Nose normal.     Mouth/Throat:     Pharynx: Oropharynx is clear.  Eyes:     Extraocular Movements: Extraocular movements intact.     Pupils: Pupils are equal, round, and reactive to light.  Cardiovascular:     Rate and Rhythm: Normal rate and regular rhythm.     Pulses: Normal  pulses.     Heart sounds: Normal heart sounds.  Pulmonary:     Effort: Pulmonary effort is normal.     Breath sounds: Normal breath sounds.  Abdominal:     General: Abdomen is flat. Bowel sounds are normal.     Palpations: Abdomen is soft.  Musculoskeletal:        General: Normal range of motion.     Cervical back: Normal range of motion and neck supple.  Skin:    General: Skin is warm and dry.  Neurological:     General: No focal deficit present.     Mental Status: He is alert and oriented to person, place, and time.  Psychiatric:        Attention and Perception: Attention and perception normal.        Mood and Affect: Mood is depressed. Affect is flat.        Speech: Speech normal.        Behavior: Behavior normal. Behavior is cooperative.        Thought Content: Thought content includes suicidal ideation.        Cognition and Memory: Cognition and memory normal.        Judgment: Judgment is impulsive.    Review of Systems  Constitutional: Negative.   HENT: Negative.    Eyes: Negative.   Respiratory: Negative.    Cardiovascular: Negative.   Gastrointestinal: Negative.   Genitourinary: Negative.   Musculoskeletal: Negative.   Skin: Negative.   Neurological: Negative.   Endo/Heme/Allergies: Negative.   Psychiatric/Behavioral:  Positive for depression and suicidal ideas.    Blood pressure 128/68, pulse 68, temperature 98.8 F (37.1 C), temperature source Oral, resp. rate 18, height 5\' 10"  (1.778 m), weight 74.8 kg, SpO2 99 %. Body mass index is 23.68 kg/m.  Treatment Plan Summary: Daily contact with patient to assess and evaluate symptoms and progress in treatment, Medication management, and Plan discontinue Zoloft and start Cymbalta.  Start doxepin at bedtime and Neurontin.  Consult PT tomorrow.  Observation Level/Precautions:  15 minute checks  Laboratory:  CBC Chemistry Profile HbAIC  Psychotherapy:    Medications:    Consultations:    Discharge Concerns:     Estimated LOS:  Other:     Physician Treatment Plan for Primary Diagnosis: MDD (major depressive disorder), recurrent episode, severe ([place]) Long Term Goal(s): Improvement in symptoms so as ready for discharge  Short Term Goals: Ability to identify changes in lifestyle to reduce recurrence of condition will improve, Ability to verbalize feelings will improve, Ability to disclose and discuss suicidal ideas, Ability to demonstrate self-control will improve, Ability to identify and develop effective coping behaviors will improve, Ability to maintain clinical measurements within normal limits will improve, Compliance with prescribed medications will improve, and Ability to identify triggers associated with substance abuse/mental health issues will improve  Physician Treatment Plan for  Secondary Diagnosis: Principal Problem:   MDD (major depressive disorder), recurrent episode, severe ([facility])   I certify that inpatient services furnished can reasonably be expected to improve the patient's condition.    Sarina Ill, DO 6/20/202310:39 AM

## 2021-12-30 NOTE — BH Assessment (Signed)
Pt walked up to the nurse's station and asked for Immodium.  When this RN inquired about his bowel movements today, the patient reports 2 loose stools today.  Per MD orders, Immodium given to patient.

## 2021-12-30 NOTE — BHH Counselor (Signed)
Adult Comprehensive Assessment  Patient ID: [name] Norris, male   DOB: 08/26/1940, 81 y.o.   MRN: [id number]  Information Source: Information source: Patient  Current Stressors:  Patient states their primary concerns and needs for treatment are:: "drug overdose, hydracodone" Patient states their goals for this hospitilization and ongoing recovery are:: "get warm, exercise, take better care of myself" Educational / Learning stressors: Pt denies Employment / Job issues: Pt denies Family Relationships: "stressEngineer, petroleum / Lack of resources (include bankruptcy): Pt denies Housing / Lack of housing: Pt denies Physical health (include injuries & life threatening diseases): Arthritis Social relationships: Pt denies Substance abuse: Opioids Bereavement / Loss: Pt states his wife passed away 01-11-2021  Living/Environment/Situation:  Living Arrangements: Alone Living conditions (as described by patient or guardian): "I have a beautiful home" How long has patient lived in current situation?: "a long time" What is atmosphere in current home: Comfortable, Loving  Family History:  Marital status: Widowed Widowed, when?: 2022-07-02Are you sexually active?: No What is your sexual orientation?: Heterosexual Has your sexual activity been affected by drugs, alcohol, medication, or emotional stress?: Pt denies Does patient have children?: Yes How many children?: 2 How is patient's relationship with their children?: Pt states he has a good relationship with his children, but they live out of state  Childhood History:  By whom was/is the patient raised?: Both parents Additional childhood history information: "taken care of myself since I was 23" Description of patient's relationship with caregiver when they were a child: "loved me and I loved them" Patient's description of current relationship with people who raised him/her: deceased How were you disciplined when you got in trouble as a  child/adolescent?: unable to assess Does patient have siblings?: Yes Number of Siblings: 1 Description of patient's current relationship with siblings: "my brother lives in [place], we keep in touch" Did patient suffer any verbal/emotional/physical/sexual abuse as a child?: No Did patient suffer from severe childhood neglect?: No Has patient ever been sexually abused/assaulted/raped as an adolescent or adult?: No Was the patient ever a victim of a crime or a disaster?: No Witnessed domestic violence?: No  Education:  Highest grade of school patient has completed: GED Currently a student?: No Learning disability?: No  Employment/Work Situation:   Employment Situation: Retired Passenger transport manager has Been Impacted by Current Illness: No What is the Longest Time Patient has Held a Job?: 15 years Where was the Patient Employed at that Time?: [employer] Has Patient ever Been in the 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?: No  Financial Resources:   Surveyor, quantity resources: [employer], Medicare Does patient have a Lawyer or guardian?: No  Alcohol/Substance Abuse:   What has been your use of drugs/alcohol within the last 12 months?: Pt denies, but is misuing his presciption opioids and can no longer be prescribed due to misuse per assessment note If attempted suicide, did drugs/alcohol play a role in this?: Yes Has alcohol/substance abuse ever caused legal problems?: No  Social Support System:   Conservation officer, nature Support System: Good Describe Merchandiser, retail System: Church friends, neighbors, children Type of faith/religion: "I attend [employer]" How does patient's faith help to cope with current illness?: Pt states that he enjoys being a part of his church community  Leisure/Recreation:   Do You Have Hobbies?: Yes Leisure and  Hobbies: exercise, staying active  Strengths/Needs:  What is the patient's perception of their strengths?: "81 and still kicking" Patient states they can use these personal strengths during their treatment to contribute to their recovery: "Know I have a great house, great neighbors" Patient states these barriers may affect/interfere with their treatment: Pt denies Patient states these barriers may affect their return to the community: Pt denies Other important information patient would like considered in planning for their treatment: Patient's son is his power of attorney  Discharge Plan:   Currently receiving community mental health services: No (Patient has provider with [facility], Dr. Chilton Si) Patient states concerns and preferences for aftercare planning are: Pt has provider, Dr. Chilton Si, for primary care and is open to mental health referral at [facility] Patient states they will know when they are safe and ready for discharge when: "talked to myself and it's stupid, what I did" Does patient have access to transportation?: Yes Does patient have financial barriers related to discharge medications?: No Will patient be returning to same living situation after discharge?: Yes  Summary/Recommendations:   Summary and Recommendations (to be completed by the evaluator): Patient is a 81 year old male, widowed, [place] [facility] He reports that he receives SSI and is retired. He presents to the hospital following suicidie attempt and depressive symptoms. Recent stressors include suicidal ideation, chronic back pain, opiod substance misuse and grief. He has a primary diagnosis of MDD (major depressive disorder), recurrent episode, severe. Patient has a provider with [facility] for pain management and is open to referral for mental health counseling.  Recommendations include: crisis stabilization, therapeutic milieu, encourage group attendance and participation, medication management  for mood stabilization and development of comprehensive mental wellness/sobriety plan.  [name] Norris. 12/30/2021

## 2021-12-31 DIAGNOSIS — F332 Major depressive disorder, recurrent severe without psychotic features: Secondary | ICD-10-CM | POA: Diagnosis not present

## 2021-12-31 MED ORDER — AMLODIPINE BESYLATE 5 MG PO TABS
10.0000 mg | ORAL_TABLET | Freq: Every day | ORAL | Status: DC
Start: 2021-12-31 — End: 2022-01-02
  Administered 2021-12-31 – 2022-01-02 (×3): 10 mg via ORAL
  Filled 2021-12-31 (×3): qty 2

## 2021-12-31 MED ORDER — DOXEPIN HCL 25 MG PO CAPS
25.0000 mg | ORAL_CAPSULE | Freq: Every day | ORAL | Status: DC
  Administered 2021-12-31 – 2022-01-01 (×2): 25 mg via ORAL
  Filled 2021-12-31 (×2): qty 1

## 2021-12-31 MED ORDER — GABAPENTIN 300 MG PO CAPS
300.0000 mg | ORAL_CAPSULE | Freq: Three times a day (TID) | ORAL | Status: DC
  Administered 2021-12-31 – 2022-01-02 (×6): 300 mg via ORAL
  Filled 2021-12-31 (×6): qty 1

## 2021-12-31 MED ORDER — ACETAMINOPHEN 325 MG PO TABS
650.0000 mg | ORAL_TABLET | Freq: Four times a day (QID) | ORAL | Status: DC | PRN
  Administered 2022-01-01 – 2022-01-02 (×4): 650 mg via ORAL
  Filled 2021-12-31 (×4): qty 2

## 2021-12-31 MED ORDER — LOSARTAN POTASSIUM 25 MG PO TABS
25.0000 mg | ORAL_TABLET | Freq: Every day | ORAL | Status: DC
Start: 2021-12-31 — End: 2021-12-31

## 2021-12-31 NOTE — Progress Notes (Signed)
[facility] MD Progress Note  12/31/2021 12:29 PM [name] Norris  MRN:  [id number] Subjective: [name] seems to be doing a little bit better today.  He does not like the current mattress.  He is tolerating the new medications which include Neurontin, doxepin, and Cymbalta.  He denies any side effects from the medications.  States that he slept a little bit better last night.  I discussed going up on his Neurontin and his doxepin and he was agreeable.  He is not going to go back to using narcotics for his back pain.  He has been on Neurontin in the past and states it was helpful so we will increase it.  Principal Problem: MDD (major depressive disorder), recurrent episode, severe (HCC) Diagnosis: Principal Problem:   MDD (major depressive disorder), recurrent episode, severe (HCC)  Total Time spent with patient: 15 minutes  Past Psychiatric History: None but he does have multiple medical problems including chronic pain which leads to his depression.  Past Medical History:  Past Medical History:  Diagnosis Date   Back pain    History reviewed. No pertinent surgical history. Family History: History reviewed. No pertinent family history.  Social History:  Social History   Substance and Sexual Activity  Alcohol Use No     Social History   Substance and Sexual Activity  Drug Use No    Social History   Socioeconomic History   Marital status: Married    Spouse name: Not on file   Number of children: Not on file   Years of education: Not on file   Highest education level: Not on file  Occupational History   Not on file  Tobacco Use   Smoking status: Former    Types: Cigarettes    Quit date: 07/10/1996    Years since quitting: 25.4   Smokeless tobacco: Not on file  Substance and Sexual Activity   Alcohol use: No   Drug use: No   Sexual activity: Not on file  Other Topics Concern   Not on file  Social History Narrative   Not on file   Social Determinants of Health   Financial  Resource Strain: Not on file  Food Insecurity: Not on file  Transportation Needs: Not on file  Physical Activity: Not on file  Stress: Not on file  Social Connections: Not on file   Additional Social History:                         Sleep: Fair  Appetite:  Fair  Current Medications: Current Facility-Administered Medications  Medication Dose Route Frequency Provider Last Rate Last Admin   acetaminophen (TYLENOL) tablet 650 mg  650 mg Oral Q6H PRN Ophelia Shoulder E, NP   650 mg at 12/31/21 0815   ascorbic acid (VITAMIN C) tablet 500 mg  500 mg Oral Daily Ophelia Shoulder E, NP   500 mg at 12/31/21 0815   cloNIDine (CATAPRES) tablet 0.1 mg  0.1 mg Oral Q6H PRN Sarina Ill, DO       dicyclomine (BENTYL) tablet 20 mg  20 mg Oral Q6H PRN Nira Conn A, NP   20 mg at 12/31/21 0814   doxepin (SINEQUAN) capsule 25 mg  25 mg Oral QHS Sarina Ill, DO       DULoxetine (CYMBALTA) DR capsule 30 mg  30 mg Oral Daily Sarina Ill, DO   30 mg at 12/31/21 0856   gabapentin (NEURONTIN) capsule 100 mg  100 mg Oral TID Sarina Ill, DO   100 mg at 12/31/21 0815   loperamide (IMODIUM) capsule 2-4 mg  2-4 mg Oral PRN Nira Conn A, NP   2 mg at 12/31/21 0955   losartan (COZAAR) tablet 25 mg  25 mg Oral Daily Sarina Ill, DO       methocarbamol (ROBAXIN) tablet 500 mg  500 mg Oral Q8H PRN Nira Conn A, NP   500 mg at 12/31/21 0816   multivitamin with minerals tablet 1 tablet  1 tablet Oral Daily Martyn Malay, RPH   1 tablet at 12/31/21 0815   ondansetron (ZOFRAN-ODT) disintegrating tablet 4 mg  4 mg Oral Q6H PRN Nira Conn A, NP   4 mg at 12/29/21 2203   tamsulosin (FLOMAX) capsule 0.4 mg  0.4 mg Oral Daily Ophelia Shoulder E, NP   0.4 mg at 12/31/21 0815    Lab Results: No results found for this or any previous visit (from the past 48 hour(s)).  Blood Alcohol level:  Lab Results  Component Value Date   ETH <10 12/27/2021   ETH <10  11/21/2021    Metabolic Disorder Labs: No results found for: "HGBA1C", "MPG" No results found for: "PROLACTIN" No results found for: "CHOL", "TRIG", "HDL", "CHOLHDL", "VLDL", "LDLCALC"  Physical Findings: AIMS:  , ,  ,  ,    CIWA:    COWS:  COWS Total Score: 0  Musculoskeletal: Strength & Muscle Tone: within normal limits Gait & Station: unsteady Patient leans: N/A  Psychiatric Specialty Exam:  Presentation  General Appearance: Casual  Eye Contact:Good  Speech:Clear and Coherent  Speech Volume:Normal  Handedness:Right   Mood and Affect  Mood:Depressed  Affect:Flat   Thought Process  Thought Processes:Linear  Descriptions of Associations:Intact  Orientation:Full (Time, Place and Person)  Thought Content:Perseveration  History of Schizophrenia/Schizoaffective disorder:No  Duration of Psychotic Symptoms:Less than six months  Hallucinations:No data recorded Ideas of Reference:Delusions  Suicidal Thoughts:No data recorded Homicidal Thoughts:No data recorded  Sensorium  Memory:Recent Fair; Remote Fair; Immediate Fair  Judgment:Poor  Insight:Poor   Executive Functions  Concentration:Fair  Attention Span:Fair  Recall:Fair  Fund of Knowledge:Good  Language:Good   Psychomotor Activity  Psychomotor Activity:No data recorded  Assets  Assets:Communication Skills; Housing; Health and safety inspector; Physical Health   Sleep  Sleep:No data recorded   Physical Exam: Physical Exam Vitals and nursing note reviewed.  Constitutional:      Appearance: Normal appearance. He is normal weight.  Neurological:     General: No focal deficit present.     Mental Status: He is alert and oriented to person, place, and time.  Psychiatric:        Attention and Perception: Attention and perception normal.        Mood and Affect: Mood normal.        Behavior: Behavior normal.    Review of Systems  Constitutional: Negative.   HENT: Negative.     Eyes: Negative.   Respiratory: Negative.    Cardiovascular: Negative.   Gastrointestinal: Negative.   Genitourinary: Negative.   Musculoskeletal: Negative.   Skin: Negative.   Neurological: Negative.   Endo/Heme/Allergies: Negative.   Psychiatric/Behavioral:  Positive for depression. The patient has insomnia.    Blood pressure (!) 151/89, pulse 75, temperature 98.4 F (36.9 C), resp. rate 18, height 5\' 10"  (1.778 m), weight 74.8 kg, SpO2 99 %. Body mass index is 23.68 kg/m.   Treatment Plan Summary: Daily contact with patient to assess and evaluate symptoms and  progress in treatment, Medication management, and Plan increase doxepin to 25 mg at bedtime.  Increase Neurontin to 300 mg 3 times a day.  Start Norvasc.  Sarina Ill, DO 12/31/2021, 12:29 PM

## 2021-12-31 NOTE — Progress Notes (Signed)
  Patient rang call bell in the bathroom, when [name] went to his room Patient was on his knees by the commode stated he slid while trying to use the bathroom. Patient is alert and oriented by 3. Patient assisted to get up and back to his bed. He denied pain or discomfort. Skin assessment done and no visible bruises or marks noted. V/S WNL.   Patient encouraged to use call bell when he wants to get out of bed. Pt has been using his front wheel walker. Provider notified. Family called but no answer ( Son [name] Norris [phone])  Patient in bed resting in no apparent distress. Support and encouragement provided.

## 2021-12-31 NOTE — Evaluation (Signed)
Physical Therapy Evaluation Patient Details Name: [name] Norris MRN: [id number] DOB: 1941/06/29 Today's Date: 12/31/2021  History of Present Illness  81 year old white male who presents on involuntary commitment status post overdose on opioids.  He does not have any psychiatric history  Clinical Impression  Pt pleasant and eager to work w/ PT.  He displayed the ability to do some ambulation w/o AD, but was clearly more steady and safe with walker.  Pt scored 41/56 on the Berg indicating need for AD.  He lives alone but assures me he has a lot of neighbors that can check-in, assist with him - even reports that they can be there 24/7 when he initially gets home.  Pt will need an AD when he goes home, pt did not need heavy UE WBing and would benefit from the maneuverability/functionality of a rollator, will also benefit from HHPT to address balance defecits.     Recommendations for follow up therapy are one component of a multi-disciplinary discharge planning process, led by the attending physician.  Recommendations may be updated based on patient status, additional functional criteria and insurance authorization.  Follow Up Recommendations Home health PT      Assistance Recommended at Discharge    Patient can return home with the following  Assist for transportation    Equipment Recommendations Rollator (4 wheels)  Recommendations for Other Services       Functional Status Assessment Patient has had a recent decline in their functional status and demonstrates the ability to make significant improvements in function in a reasonable and predictable amount of time.     Precautions / Restrictions Precautions Precautions: Fall Restrictions Weight Bearing Restrictions: No      Mobility  Bed Mobility Overal bed mobility: Independent                  Transfers Overall transfer level: Independent Equipment used: None, Rolling walker (2 wheels)               General  transfer comment: pt was able to rise and w/o AD, did struggle to control descent w/o UEs    Ambulation/Gait Ambulation/Gait assistance: Supervision Gait Distance (Feet): 250 Feet Assistive device: Rolling walker (2 wheels), None         General Gait Details: Pt was able to do some ambulation t/o AD and though he did not have any overt LOBs displays some general unsteadiness and hesitancy.  Stairs            Wheelchair Mobility    Modified Rankin (Stroke Patients Only)       Balance Overall balance assessment: Needs assistance Sitting-balance support: No upper extremity supported Sitting balance-Leahy Scale: Normal     Standing balance support: No upper extremity supported Standing balance-Leahy Scale: Fair                   Standardized Balance Assessment Standardized Balance Assessment : Berg Balance Test Berg Balance Test Sit to Stand: Able to stand without using hands and stabilize independently Standing Unsupported: Able to stand safely 2 minutes Sitting with Back Unsupported but Feet Supported on Floor or Stool: Able to sit safely and securely 2 minutes Stand to Sit: Uses backs of legs against chair to control descent Transfers: Able to transfer safely, minor use of hands Standing Unsupported with Eyes Closed: Able to stand 10 seconds safely Standing Ubsupported with Feet Together: Able to place feet together independently and stand for 1 minute with supervision From Standing, Reach Forward  with Outstretched Arm: Can reach forward >12 cm safely (5") From Standing Position, Pick up Object from Floor: Able to pick up shoe safely and easily From Standing Position, Turn to Look Behind Over each Shoulder: Looks behind one side only/other side shows less weight shift Turn 360 Degrees: Able to turn 360 degrees safely one side only in 4 seconds or less Standing Unsupported, Alternately Place Feet on Step/Stool: Able to complete >2 steps/needs minimal  assist Standing Unsupported, One Foot in Front: Needs help to step but can hold 15 seconds Standing on One Leg: Tries to lift leg/unable to hold 3 seconds but remains standing independently Total Score: 41         Pertinent Vitals/Pain Pain Assessment Pain Assessment: Faces Faces Pain Scale: Hurts a little bit Pain Location: c/o chronic LBP that does not seem to functionally limit him today    Home Living Family/patient expects to be discharged to:: Private residence Living Arrangements: Alone Available Help at Discharge: Neighbor;Available PRN/intermittently Type of Home: House Home Access: Stairs to enter Entrance Stairs-Rails: Right;Left (wide) Entrance Stairs-Number of Steps: 4   Home Layout: One level Home Equipment: Crutches      Prior Function Prior Level of Function : Independent/Modified Independent             Mobility Comments: Pt apparently able to stay active, runs his errands, etc       Hand Dominance        Extremity/Trunk Assessment   Upper Extremity Assessment Upper Extremity Assessment: Overall WFL for tasks assessed    Lower Extremity Assessment Lower Extremity Assessment: Overall WFL for tasks assessed       Communication   Communication: No difficulties  Cognition Arousal/Alertness: Awake/alert Behavior During Therapy: WFL for tasks assessed/performed Overall Cognitive Status: Within Functional Limits for tasks assessed                                          General Comments General comments (skin integrity, edema, etc.): Pt pleasant and eager to work with PT    Exercises     Assessment/Plan    PT Assessment Patient needs continued PT services  PT Problem List Decreased balance;Decreased mobility;Decreased knowledge of use of DME;Decreased safety awareness;Pain       PT Treatment Interventions DME instruction;Gait training;Stair training;Functional mobility training;Therapeutic activities;Therapeutic  exercise;Balance training;Neuromuscular re-education;Patient/family education    PT Goals (Current goals can be found in the Care Plan section)  Acute Rehab PT Goals Patient Stated Goal: Go home PT Goal Formulation: With patient Time For Goal Achievement: 01/13/22 Potential to Achieve Goals: Good    Frequency Min 2X/week     Co-evaluation               AM-PAC PT "6 Clicks" Mobility  Outcome Measure Help needed turning from your back to your side while in a flat bed without using bedrails?: None Help needed moving from lying on your back to sitting on the side of a flat bed without using bedrails?: None Help needed moving to and from a bed to a chair (including a wheelchair)?: None Help needed standing up from a chair using your arms (e.g., wheelchair or bedside chair)?: None Help needed to walk in hospital room?: A Little Help needed climbing 3-5 steps with a railing? : A Little 6 Click Score: 22    End of Session Equipment Utilized During Treatment: Gait  belt Activity Tolerance: Patient tolerated treatment well Patient left: in bed   PT Visit Diagnosis: Unsteadiness on feet (R26.81);Difficulty in walking, not elsewhere classified (R26.2)    Time: 6010-9323 PT Time Calculation (min) (ACUTE ONLY): 29 min   Charges:   PT Evaluation $PT Eval Low Complexity: 1 Low PT Treatments $Therapeutic Activity: 8-22 mins        Malachi Pro, DPT 12/31/2021, 4:37 PM

## 2021-12-31 NOTE — BH IP Treatment Plan (Signed)
Interdisciplinary Treatment and Diagnostic Plan Update  12/31/2021 Time of Session: 10:15AM [name] Norris MRN: [id number]  Principal Diagnosis: MDD (major depressive disorder), recurrent episode, severe ([place])  Secondary Diagnoses: Principal Problem:   MDD (major depressive disorder), recurrent episode, severe ([place])   Current Medications:  Current Facility-Administered Medications  Medication Dose Route Frequency Provider Last Rate Last Admin   acetaminophen (TYLENOL) tablet 650 mg  650 mg Oral Q6H PRN Merlyn Lot E, NP   650 mg at 12/31/21 0815   ascorbic acid (VITAMIN C) tablet 500 mg  500 mg Oral Daily Merlyn Lot E, NP   500 mg at 12/31/21 0815   cloNIDine (CATAPRES) tablet 0.1 mg  0.1 mg Oral Q6H PRN Parks Ranger, DO       dicyclomine (BENTYL) tablet 20 mg  20 mg Oral Q6H PRN Lindon Romp A, NP   20 mg at 12/31/21 0814   doxepin (SINEQUAN) capsule 10 mg  10 mg Oral QHS Parks Ranger, DO   10 mg at 12/30/21 2126   DULoxetine (CYMBALTA) DR capsule 30 mg  30 mg Oral Daily Parks Ranger, DO   30 mg at 12/31/21 0856   gabapentin (NEURONTIN) capsule 100 mg  100 mg Oral TID Parks Ranger, DO   100 mg at 12/31/21 0815   loperamide (IMODIUM) capsule 2-4 mg  2-4 mg Oral PRN Lindon Romp A, NP   2 mg at 12/31/21 0955   methocarbamol (ROBAXIN) tablet 500 mg  500 mg Oral Q8H PRN Lindon Romp A, NP   500 mg at 12/31/21 0816   multivitamin with minerals tablet 1 tablet  1 tablet Oral Daily Lorna Dibble, RPH   1 tablet at 12/31/21 0815   ondansetron (ZOFRAN-ODT) disintegrating tablet 4 mg  4 mg Oral Q6H PRN Lindon Romp A, NP   4 mg at 12/29/21 2203   tamsulosin (FLOMAX) capsule 0.4 mg  0.4 mg Oral Daily Merlyn Lot E, NP   0.4 mg at 12/31/21 0815   PTA Medications: Medications Prior to Admission  Medication Sig Dispense Refill Last Dose   acetaminophen (TYLENOL) 500 MG tablet Take 500 mg by mouth every 6 (six) hours as needed for mild pain.       b complex vitamins tablet Take 1 tablet by mouth daily.      Calcium-Magnesium-Vitamin D (CALCIUM 500 PO) Take 1 tablet by mouth daily.      fluticasone (FLONASE) 50 MCG/ACT nasal spray Place 2 sprays into both nostrils daily.  5    Multiple Vitamins-Minerals (MENS 50+ MULTI VITAMIN/MIN) TABS Take 1 tablet by mouth daily.      tamsulosin (FLOMAX) 0.4 MG CAPS capsule Take 1 capsule by mouth daily. At bed time      traMADol (ULTRAM) 50 MG tablet Take 2 tablets by mouth every 8 (eight) hours as needed.      vitamin C (ASCORBIC ACID) 500 MG tablet Take 500 mg by mouth daily.      VITAMIN D, CHOLECALCIFEROL, PO Take 1 tablet by mouth daily.      VITAMIN E PO Take 1 capsule by mouth daily.       Patient Stressors:    Patient Strengths: Capable of independent living  Marketing executive fund of knowledge   Treatment Modalities: Medication Management, Group therapy, Case management,  1 to 1 session with clinician, Psychoeducation, Recreational therapy.   Physician Treatment Plan for Primary Diagnosis: MDD (major depressive disorder), recurrent episode, severe ([place]) Long Term Goal(s):  Improvement in symptoms so as ready for discharge   Short Term Goals: Ability to identify changes in lifestyle to reduce recurrence of condition will improve Ability to verbalize feelings will improve Ability to disclose and discuss suicidal ideas Ability to demonstrate self-control will improve Ability to identify and develop effective coping behaviors will improve Ability to maintain clinical measurements within normal limits will improve Compliance with prescribed medications will improve Ability to identify triggers associated with substance abuse/mental health issues will improve  Medication Management: Evaluate patient's response, side effects, and tolerance of medication regimen.  Therapeutic Interventions: 1 to 1 sessions, Unit Group sessions and Medication administration.  Evaluation of  Outcomes: Not Met  Physician Treatment Plan for Secondary Diagnosis: Principal Problem:   MDD (major depressive disorder), recurrent episode, severe ([place])  Long Term Goal(s): Improvement in symptoms so as ready for discharge   Short Term Goals: Ability to identify changes in lifestyle to reduce recurrence of condition will improve Ability to verbalize feelings will improve Ability to disclose and discuss suicidal ideas Ability to demonstrate self-control will improve Ability to identify and develop effective coping behaviors will improve Ability to maintain clinical measurements within normal limits will improve Compliance with prescribed medications will improve Ability to identify triggers associated with substance abuse/mental health issues will improve     Medication Management: Evaluate patient's response, side effects, and tolerance of medication regimen.  Therapeutic Interventions: 1 to 1 sessions, Unit Group sessions and Medication administration.  Evaluation of Outcomes: Not Met   RN Treatment Plan for Primary Diagnosis: MDD (major depressive disorder), recurrent episode, severe ([place]) Long Term Goal(s): Knowledge of disease and therapeutic regimen to maintain health will improve  Short Term Goals: Ability to remain free from injury will improve, Ability to verbalize frustration and anger appropriately will improve, Ability to demonstrate self-control, Ability to participate in decision making will improve, Ability to verbalize feelings will improve, Ability to disclose and discuss suicidal ideas, Ability to identify and develop effective coping behaviors will improve, and Compliance with prescribed medications will improve  Medication Management: RN will administer medications as ordered by provider, will assess and evaluate patient's response and provide education to patient for prescribed medication. RN will report any adverse and/or side effects to prescribing  provider.  Therapeutic Interventions: 1 on 1 counseling sessions, Psychoeducation, Medication administration, Evaluate responses to treatment, Monitor vital signs and CBGs as ordered, Perform/monitor CIWA, COWS, AIMS and Fall Risk screenings as ordered, Perform wound care treatments as ordered.  Evaluation of Outcomes: Not Met   LCSW Treatment Plan for Primary Diagnosis: MDD (major depressive disorder), recurrent episode, severe ([place]) Long Term Goal(s): Safe transition to appropriate next level of care at discharge, Engage patient in therapeutic group addressing interpersonal concerns.  Short Term Goals: Engage patient in aftercare planning with referrals and resources, Increase social support, Increase ability to appropriately verbalize feelings, Increase emotional regulation, Facilitate acceptance of mental health diagnosis and concerns, Identify triggers associated with mental health/substance abuse issues, and Increase skills for wellness and recovery  Therapeutic Interventions: Assess for all discharge needs, 1 to 1 time with Social worker, Explore available resources and support systems, Assess for adequacy in community support network, Educate family and significant other(s) on suicide prevention, Complete Psychosocial Assessment, Interpersonal group therapy.  Evaluation of Outcomes: Not Met   Progress in Treatment: Attending groups: No. Participating in groups: No. Taking medication as prescribed: Yes. Toleration medication: Yes. Family/Significant other contact made: No, will contact:  will contact when given permission Patient understands diagnosis:  Yes. Discussing patient identified problems/goals with staff: Yes. Medical problems stabilized or resolved: Yes. Denies suicidal/homicidal ideation: Yes. Issues/concerns per patient self-inventory: No. Other: None  New problem(s) identified: No, Describe:  None  New Short Term/Long Term Goal(s): Patient to work towards medication  management for mood stabilization; elimination of SI thoughts; development of comprehensive mental wellness/sobriety plan.   Patient Goals: "get sleep, get meds right and go home"  Discharge Plan or Barriers: CSW will assist pt with development of appropriate discharge/aftercare plan. Pt informed staff that son is working to find him placement at ALF.   Reason for Continuation of Hospitalization: Depression Medication stabilization Suicidal ideation  Estimated Length of Stay: 1-7 days  Last 3 [place] Suicide Severity Risk Score: Flowsheet Row Admission (Current) from 12/29/2021 in [place] Most recent reading at 12/31/2021  5:43 AM ED from 12/27/2021 in [place] Most recent reading at 12/27/2021 11:19 PM ED from 12/27/2021 in [facility] Most recent reading at 12/27/2021 10:53 PM  C-SSRS RISK CATEGORY High Risk Low Risk High Risk       Last PHQ 2/9 Scores:     No data to display          Scribe for Treatment Team: Ulmer Degen A [place], [place] 12/31/2021 10:40 AM

## 2021-12-31 NOTE — Plan of Care (Signed)
  Problem: Education: Goal: Knowledge of General Education information will improve Description: Including pain rating scale, medication(s)/side effects and non-pharmacologic comfort measures Outcome: Progressing   Problem: Health Behavior/Discharge Planning: Goal: Ability to manage health-related needs will improve Outcome: Progressing   Problem: Clinical Measurements: Goal: Ability to maintain clinical measurements within normal limits will improve Outcome: Progressing Goal: Will remain free from infection Outcome: Progressing Goal: Diagnostic test results will improve Outcome: Progressing Goal: Respiratory complications will improve Outcome: Progressing Goal: Cardiovascular complication will be avoided Outcome: Progressing   Problem: Activity: Goal: Risk for activity intolerance will decrease Outcome: Progressing   Problem: Nutrition: Goal: Adequate nutrition will be maintained Outcome: Progressing   Problem: Coping: Goal: Level of anxiety will decrease Outcome: Progressing   Problem: Elimination: Goal: Will not experience complications related to bowel motility Outcome: Progressing Goal: Will not experience complications related to urinary retention Outcome: Progressing   Problem: Pain Managment: Goal: General experience of comfort will improve Outcome: Progressing   Problem: Safety: Goal: Ability to remain free from injury will improve Outcome: Progressing   Problem: Skin Integrity: Goal: Risk for impaired skin integrity will decrease Outcome: Progressing   Problem: Education: Goal: Knowledge of [place] [facility] Education information/materials will improve Outcome: Progressing Goal: Emotional status will improve Outcome: Progressing Goal: Mental status will improve Outcome: Progressing Goal: Verbalization of understanding the information provided will improve Outcome: Progressing   Problem: Activity: Goal: Interest or engagement in activities will  improve Outcome: Progressing Goal: Sleeping patterns will improve Outcome: Progressing   Problem: Coping: Goal: Ability to verbalize frustrations and anger appropriately will improve Outcome: Progressing Goal: Ability to demonstrate self-control will improve Outcome: Progressing   Problem: Health Behavior/Discharge Planning: Goal: Identification of resources available to assist in meeting health care needs will improve Outcome: Progressing Goal: Compliance with treatment plan for underlying cause of condition will improve Outcome: Progressing   Problem: Physical Regulation: Goal: Ability to maintain clinical measurements within normal limits will improve Outcome: Progressing   Problem: Safety: Goal: Periods of time without injury will increase Outcome: Progressing   

## 2021-12-31 NOTE — BHH Counselor (Signed)
CSW was contacted by pt's son, [name] Norris, [phone] and discussed pt's discharge/aftercare plan.   He stated that [facility] in [place] was an ALF that pt would be considering placement at post discharge.  CSW will reach out and assist with referral process as needed. He stated that [facility] Cower was POC at facility.   Patient would be receiving through his usual home health  agency for nursing/medication management and moderate home assistance in the interim until pt is accepted at an ALF.   He stated that he attempted to remove any remaining weapons from father's home. He stated that he did not find anything. Per pt, there is a rifle. Pt was given information about safety with weapons and pt's son was informed of education as well.   CSW will assist with referral for therapy at [facility], though pt's son said therapy in the past has not been successful in terms of pt's attendance.   CSW will follow up with son regarding pt discharge date and updates throughout admission.   Abbey Veith [place], MSW, LCSW-A 6/21/202311:42 AM

## 2022-01-01 DIAGNOSIS — F332 Major depressive disorder, recurrent severe without psychotic features: Secondary | ICD-10-CM | POA: Diagnosis not present

## 2022-01-01 DIAGNOSIS — G8929 Other chronic pain: Secondary | ICD-10-CM | POA: Diagnosis present

## 2022-01-01 NOTE — H&P (Signed)
CSW contacted Elnita Maxwell, [phone] at [facility] to assist pt with [facility] PT services at discharge. [facility] are [facility] and will start working on authorizations for pt.  She stated that it can be up to a week before pt is scheduled to be seen  at home but would expedite process as much as possible.   [name] Norris, MSW, LCSW-A 6/22/20234:19 PM

## 2022-01-01 NOTE — Progress Notes (Signed)
Patient is alert and oriented times 4. Mood and affect appropriate to situation. Patient rates pain as 9/10 to lower back- PRN Tylenol and Robaxin given. He denies SI, HI, and AVH. Also denies feelings of anxiety and depression at this time. States he slept good last night. Morning meds given whole by mouth W/O difficulty. Ate breakfast in day room- appetite good. Patient remains on unit with Q15 minute checks in place.

## 2022-01-01 NOTE — Progress Notes (Signed)
Pt alert oriented , oriented - reported pain at 6 in back (per pt severe arthritis diagnoses) - req Pain meds  reported to RN

## 2022-01-01 NOTE — Progress Notes (Signed)
[facility] MD Progress Note  01/01/2022 1:29 PM [name] Norris  MRN:  [id number] Subjective: [name] Norris is doing much better today.  He is sleeping better and his mood is improved.  He states that his pain is slightly better.  Tolerating his medications and denies any side effects.  He is currently on Cymbalta, doxepin, and Neurontin.  He denies any suicidal ideation.  Social work has been in Aeronaut[employer] with his son who says that he was receiving home health and would like that to resume upon discharge until he can find assisted living.  He is doing better.  His blood pressure has normalized on Norvasc.  Principal Problem: MDD (major depressive disorder), recurrent episode, severe (HCC) Diagnosis: Principal Problem:   MDD (major depressive disorder), recurrent episode, severe (HCC)  Total Time spent with patient: 15 minutes  Past Psychiatric History: None  Past Medical History:  Past Medical History:  Diagnosis Date   Back pain    History reviewed. No pertinent surgical history. Family History: History reviewed. No pertinent family history.  Social History:  Social History   Substance and Sexual Activity  Alcohol Use No     Social History   Substance and Sexual Activity  Drug Use No    Social History   Socioeconomic History   Marital status: Married    Spouse name: Not on file   Number of children: Not on file   Years of education: Not on file   Highest education level: Not on file  Occupational History   Not on file  Tobacco Use   Smoking status: Former    Types: Cigarettes    Quit date: 07/10/1996    Years since quitting: 25.4   Smokeless tobacco: Not on file  Substance and Sexual Activity   Alcohol use: No   Drug use: No   Sexual activity: Not on file  Other Topics Concern   Not on file  Social History Narrative   Not on file   Social Determinants of Health   Financial Resource Strain: Not on file  Food Insecurity: Not on file  Transportation Needs: Not on file   Physical Activity: Not on file  Stress: Not on file  Social Connections: Not on file   Additional Social History:                         Sleep: Good  Appetite:  Good  Current Medications: Current Facility-Administered Medications  Medication Dose Route Frequency Provider Last Rate Last Admin   acetaminophen (TYLENOL) tablet 650 mg  650 mg Oral Q6H PRN Gillermo Murdoch, NP   650 mg at 01/01/22 0836   amLODipine (NORVASC) tablet 10 mg  10 mg Oral Daily Sarina Ill, DO   10 mg at 01/01/22 7169   ascorbic acid (VITAMIN C) tablet 500 mg  500 mg Oral Daily Ophelia Shoulder E, NP   500 mg at 01/01/22 6789   cloNIDine (CATAPRES) tablet 0.1 mg  0.1 mg Oral Q6H PRN Sarina Ill, DO       dicyclomine (BENTYL) tablet 20 mg  20 mg Oral Q6H PRN Nira Conn A, NP   20 mg at 01/01/22 0836   doxepin (SINEQUAN) capsule 25 mg  25 mg Oral QHS Sarina Ill, DO   25 mg at 12/31/21 2119   DULoxetine (CYMBALTA) DR capsule 30 mg  30 mg Oral Daily Sarina Ill, DO   30 mg at 01/01/22 0836   gabapentin (NEURONTIN)  capsule 300 mg  300 mg Oral TID Sarina Ill, DO   300 mg at 01/01/22 8295   loperamide (IMODIUM) capsule 2-4 mg  2-4 mg Oral PRN Nira Conn A, NP   2 mg at 12/31/21 1243   methocarbamol (ROBAXIN) tablet 500 mg  500 mg Oral Q8H PRN Nira Conn A, NP   500 mg at 01/01/22 0836   multivitamin with minerals tablet 1 tablet  1 tablet Oral Daily Martyn Malay, RPH   1 tablet at 01/01/22 0836   ondansetron (ZOFRAN-ODT) disintegrating tablet 4 mg  4 mg Oral Q6H PRN Nira Conn A, NP   4 mg at 12/29/21 2203   tamsulosin (FLOMAX) capsule 0.4 mg  0.4 mg Oral Daily Ophelia Shoulder E, NP   0.4 mg at 01/01/22 6213    Lab Results: No results found for this or any previous visit (from the past 48 hour(s)).  Blood Alcohol level:  Lab Results  Component Value Date   ETH <10 12/27/2021   ETH <10 11/21/2021    Metabolic Disorder Labs: No  results found for: "HGBA1C", "MPG" No results found for: "PROLACTIN" No results found for: "CHOL", "TRIG", "HDL", "CHOLHDL", "VLDL", "LDLCALC"  Physical Findings: AIMS:  , ,  ,  ,    CIWA:    COWS:  COWS Total Score: 0  Musculoskeletal: Strength & Muscle Tone: within normal limits Gait & Station: unsteady Patient leans: N/A  Psychiatric Specialty Exam:  Presentation  General Appearance: Casual  Eye Contact:Good  Speech:Clear and Coherent  Speech Volume:Normal  Handedness:Right   Mood and Affect  Mood:Depressed  Affect:Flat   Thought Process  Thought Processes:Linear  Descriptions of Associations:Intact  Orientation:Full (Time, Place and Person)  Thought Content:Perseveration  History of Schizophrenia/Schizoaffective disorder:No  Duration of Psychotic Symptoms:Less than six months  Hallucinations:No data recorded Ideas of Reference:Delusions  Suicidal Thoughts:No data recorded Homicidal Thoughts:No data recorded  Sensorium  Memory:Recent Fair; Remote Fair; Immediate Fair  Judgment:Poor  Insight:Poor   Executive Functions  Concentration:Fair  Attention Span:Fair  Recall:Fair  Fund of Knowledge:Good  Language:Good   Psychomotor Activity  Psychomotor Activity:No data recorded  Assets  Assets:Communication Skills; Housing; Health and safety inspector; Physical Health   Sleep  Sleep:No data recorded   Physical Exam: Physical Exam Vitals and nursing note reviewed.  Constitutional:      Appearance: Normal appearance. He is normal weight.  Neurological:     General: No focal deficit present.     Mental Status: He is alert and oriented to person, place, and time.  Psychiatric:        Attention and Perception: Attention and perception normal.        Mood and Affect: Mood and affect normal.        Speech: Speech normal.        Behavior: Behavior normal. Behavior is cooperative.        Thought Content: Thought content normal.         Cognition and Memory: Cognition and memory normal.        Judgment: Judgment normal.    Review of Systems  Constitutional: Negative.   HENT: Negative.    Eyes: Negative.   Respiratory: Negative.    Cardiovascular: Negative.   Gastrointestinal: Negative.   Genitourinary: Negative.   Musculoskeletal: Negative.   Skin: Negative.   Neurological: Negative.   Endo/Heme/Allergies: Negative.   Psychiatric/Behavioral: Negative.     Blood pressure 139/83, pulse 80, temperature 98.5 F (36.9 C), resp. rate 20, height 5\' 10"  (1.778  m), weight 74.8 kg, SpO2 98 %. Body mass index is 23.68 kg/m.   Treatment Plan Summary: Daily contact with patient to assess and evaluate symptoms and progress in treatment, Medication management, and Plan continue current medications.  Sarina Ill, DO 01/01/2022, 1:29 PM

## 2022-01-01 NOTE — BH Assessment (Signed)
1910 Received patient sitting in the day room he later approached the nurses desk to speak with this writer about his night time medications. He is currently denying SI/HI, A/V hallucinations. He affect is blunt but he is seen interacting and watching TV. He is ambulating using a walker and is seen as having a steady gait.   2210 Patient interviewed he took his medication without difficulty. He requested and received Robaxin 500 mg PO at 2210 back pain 7/10.   0425 Patient requested and received Tylenol 650 mg for lower back pain 4/10.   615 Patient did not that his pain had decreased to 4/10. He has rested quietly in bed most of this shift and has remained safe. Will continue to monitor patient for safety.

## 2022-01-01 NOTE — Progress Notes (Signed)
Pt req pain req pain meds - pain level 8- notified RN

## 2022-01-01 NOTE — Plan of Care (Signed)
  Problem: Education: Goal: Knowledge of General Education information will improve Description: Including pain rating scale, medication(s)/side effects and non-pharmacologic comfort measures Outcome: Progressing   Problem: Health Behavior/Discharge Planning: Goal: Ability to manage health-related needs will improve Outcome: Progressing   Problem: Clinical Measurements: Goal: Ability to maintain clinical measurements within normal limits will improve Outcome: Progressing Goal: Will remain free from infection Outcome: Progressing Goal: Diagnostic test results will improve Outcome: Progressing Goal: Respiratory complications will improve Outcome: Progressing Goal: Cardiovascular complication will be avoided Outcome: Progressing   Problem: Activity: Goal: Risk for activity intolerance will decrease Outcome: Progressing   Problem: Nutrition: Goal: Adequate nutrition will be maintained Outcome: Progressing   Problem: Coping: Goal: Level of anxiety will decrease Outcome: Progressing   Problem: Elimination: Goal: Will not experience complications related to bowel motility Outcome: Progressing Goal: Will not experience complications related to urinary retention Outcome: Progressing   Problem: Pain Managment: Goal: General experience of comfort will improve Outcome: Progressing   Problem: Safety: Goal: Ability to remain free from injury will improve Outcome: Progressing   Problem: Skin Integrity: Goal: Risk for impaired skin integrity will decrease Outcome: Progressing   Problem: Education: Goal: Knowledge of [place] [facility] Education information/materials will improve Outcome: Progressing Goal: Emotional status will improve Outcome: Progressing Goal: Mental status will improve Outcome: Progressing Goal: Verbalization of understanding the information provided will improve Outcome: Progressing   Problem: Activity: Goal: Interest or engagement in activities will  improve Outcome: Progressing Goal: Sleeping patterns will improve Outcome: Progressing   Problem: Coping: Goal: Ability to verbalize frustrations and anger appropriately will improve Outcome: Progressing Goal: Ability to demonstrate self-control will improve Outcome: Progressing   Problem: Health Behavior/Discharge Planning: Goal: Identification of resources available to assist in meeting health care needs will improve Outcome: Progressing Goal: Compliance with treatment plan for underlying cause of condition will improve Outcome: Progressing   Problem: Physical Regulation: Goal: Ability to maintain clinical measurements within normal limits will improve Outcome: Progressing   Problem: Safety: Goal: Periods of time without injury will increase Outcome: Progressing   

## 2022-01-02 DIAGNOSIS — F332 Major depressive disorder, recurrent severe without psychotic features: Secondary | ICD-10-CM | POA: Diagnosis not present

## 2022-01-02 MED ORDER — DULOXETINE HCL 30 MG PO CPEP: 30.0000 mg | ORAL_CAPSULE | Freq: Every day | ORAL | 3 refills | Status: AC

## 2022-01-02 MED ORDER — GABAPENTIN 300 MG PO CAPS
300.0000 mg | ORAL_CAPSULE | Freq: Three times a day (TID) | ORAL | 3 refills | Status: AC
Start: 2022-01-02 — End: ?

## 2022-01-02 MED ORDER — DOXEPIN HCL 25 MG PO CAPS
25.0000 mg | ORAL_CAPSULE | Freq: Every day | ORAL | 3 refills | Status: AC
Start: 2022-01-02 — End: ?

## 2022-01-02 MED ORDER — AMLODIPINE BESYLATE 10 MG PO TABS: 10.0000 mg | ORAL_TABLET | Freq: Every day | ORAL | 3 refills | Status: AC

## 2022-01-02 NOTE — Progress Notes (Signed)
Patient was educated on prescriptions, follow up care and when/where to call for help/suicide hotline. Pt questions were answered and pt verbalized understanding and did not voice any concerns. Pt's belongings were returned. Patient denies SI, HI, and AVH. He is not observed to be in distress at time of discharge. Pt was safely discharged to the [facility].

## 2022-01-29 DIAGNOSIS — R350 Frequency of micturition: Secondary | ICD-10-CM | POA: Diagnosis not present

## 2022-02-03 DIAGNOSIS — G894 Chronic pain syndrome: Secondary | ICD-10-CM | POA: Diagnosis not present

## 2022-02-03 DIAGNOSIS — F332 Major depressive disorder, recurrent severe without psychotic features: Secondary | ICD-10-CM | POA: Diagnosis not present

## 2022-03-04 DIAGNOSIS — M159 Polyosteoarthritis, unspecified: Secondary | ICD-10-CM | POA: Diagnosis not present

## 2022-03-04 DIAGNOSIS — N183 Chronic kidney disease, stage 3 unspecified: Secondary | ICD-10-CM | POA: Diagnosis not present

## 2022-03-04 DIAGNOSIS — L4050 Arthropathic psoriasis, unspecified: Secondary | ICD-10-CM | POA: Diagnosis not present

## 2022-03-04 DIAGNOSIS — E039 Hypothyroidism, unspecified: Secondary | ICD-10-CM | POA: Diagnosis not present

## 2022-03-04 DIAGNOSIS — I10 Essential (primary) hypertension: Secondary | ICD-10-CM | POA: Diagnosis not present

## 2022-03-04 DIAGNOSIS — E7800 Pure hypercholesterolemia, unspecified: Secondary | ICD-10-CM | POA: Diagnosis not present

## 2022-03-04 DIAGNOSIS — N401 Enlarged prostate with lower urinary tract symptoms: Secondary | ICD-10-CM | POA: Diagnosis not present

## 2022-03-04 DIAGNOSIS — G8929 Other chronic pain: Secondary | ICD-10-CM | POA: Diagnosis not present

## 2022-03-10 DIAGNOSIS — R5383 Other fatigue: Secondary | ICD-10-CM | POA: Diagnosis not present

## 2022-03-10 DIAGNOSIS — Z6828 Body mass index (BMI) 28.0-28.9, adult: Secondary | ICD-10-CM | POA: Diagnosis not present

## 2022-03-10 DIAGNOSIS — L4050 Arthropathic psoriasis, unspecified: Secondary | ICD-10-CM | POA: Diagnosis not present

## 2022-03-10 DIAGNOSIS — E663 Overweight: Secondary | ICD-10-CM | POA: Diagnosis not present

## 2022-03-10 DIAGNOSIS — L409 Psoriasis, unspecified: Secondary | ICD-10-CM | POA: Diagnosis not present

## 2022-03-10 DIAGNOSIS — M1991 Primary osteoarthritis, unspecified site: Secondary | ICD-10-CM | POA: Diagnosis not present

## 2022-03-31 DIAGNOSIS — H524 Presbyopia: Secondary | ICD-10-CM | POA: Diagnosis not present

## 2022-03-31 DIAGNOSIS — Z961 Presence of intraocular lens: Secondary | ICD-10-CM | POA: Diagnosis not present

## 2022-03-31 DIAGNOSIS — H5203 Hypermetropia, bilateral: Secondary | ICD-10-CM | POA: Diagnosis not present

## 2022-03-31 DIAGNOSIS — H52223 Regular astigmatism, bilateral: Secondary | ICD-10-CM | POA: Diagnosis not present

## 2022-04-02 DIAGNOSIS — R3915 Urgency of urination: Secondary | ICD-10-CM | POA: Diagnosis not present

## 2022-04-02 DIAGNOSIS — R350 Frequency of micturition: Secondary | ICD-10-CM | POA: Diagnosis not present

## 2022-04-02 DIAGNOSIS — N401 Enlarged prostate with lower urinary tract symptoms: Secondary | ICD-10-CM | POA: Diagnosis not present

## 2022-04-10 DIAGNOSIS — M159 Polyosteoarthritis, unspecified: Secondary | ICD-10-CM | POA: Diagnosis not present

## 2022-04-10 DIAGNOSIS — F332 Major depressive disorder, recurrent severe without psychotic features: Secondary | ICD-10-CM | POA: Diagnosis not present

## 2022-04-10 DIAGNOSIS — G8929 Other chronic pain: Secondary | ICD-10-CM | POA: Diagnosis not present

## 2022-04-10 DIAGNOSIS — E7800 Pure hypercholesterolemia, unspecified: Secondary | ICD-10-CM | POA: Diagnosis not present

## 2022-04-10 DIAGNOSIS — E039 Hypothyroidism, unspecified: Secondary | ICD-10-CM | POA: Diagnosis not present

## 2022-04-10 DIAGNOSIS — I10 Essential (primary) hypertension: Secondary | ICD-10-CM | POA: Diagnosis not present

## 2022-04-10 DIAGNOSIS — N401 Enlarged prostate with lower urinary tract symptoms: Secondary | ICD-10-CM | POA: Diagnosis not present

## 2022-04-10 DIAGNOSIS — N183 Chronic kidney disease, stage 3 unspecified: Secondary | ICD-10-CM | POA: Diagnosis not present

## 2022-05-04 DIAGNOSIS — Z23 Encounter for immunization: Secondary | ICD-10-CM | POA: Diagnosis not present

## 2022-05-11 DIAGNOSIS — R350 Frequency of micturition: Secondary | ICD-10-CM | POA: Diagnosis not present

## 2022-05-11 DIAGNOSIS — R3915 Urgency of urination: Secondary | ICD-10-CM | POA: Diagnosis not present

## 2022-05-11 DIAGNOSIS — N3941 Urge incontinence: Secondary | ICD-10-CM | POA: Diagnosis not present

## 2022-05-11 DIAGNOSIS — R351 Nocturia: Secondary | ICD-10-CM | POA: Diagnosis not present

## 2022-05-11 DIAGNOSIS — N401 Enlarged prostate with lower urinary tract symptoms: Secondary | ICD-10-CM | POA: Diagnosis not present

## 2022-05-12 DIAGNOSIS — M1991 Primary osteoarthritis, unspecified site: Secondary | ICD-10-CM | POA: Diagnosis not present

## 2022-05-12 DIAGNOSIS — E663 Overweight: Secondary | ICD-10-CM | POA: Diagnosis not present

## 2022-05-12 DIAGNOSIS — L4050 Arthropathic psoriasis, unspecified: Secondary | ICD-10-CM | POA: Diagnosis not present

## 2022-05-12 DIAGNOSIS — Z6827 Body mass index (BMI) 27.0-27.9, adult: Secondary | ICD-10-CM | POA: Diagnosis not present

## 2022-05-12 DIAGNOSIS — L409 Psoriasis, unspecified: Secondary | ICD-10-CM | POA: Diagnosis not present

## 2022-07-10 DIAGNOSIS — E039 Hypothyroidism, unspecified: Secondary | ICD-10-CM | POA: Diagnosis not present

## 2022-07-10 DIAGNOSIS — I10 Essential (primary) hypertension: Secondary | ICD-10-CM | POA: Diagnosis not present

## 2022-07-10 DIAGNOSIS — F332 Major depressive disorder, recurrent severe without psychotic features: Secondary | ICD-10-CM | POA: Diagnosis not present

## 2022-07-10 DIAGNOSIS — L4050 Arthropathic psoriasis, unspecified: Secondary | ICD-10-CM | POA: Diagnosis not present

## 2022-07-10 DIAGNOSIS — G8929 Other chronic pain: Secondary | ICD-10-CM | POA: Diagnosis not present

## 2022-07-10 DIAGNOSIS — E7800 Pure hypercholesterolemia, unspecified: Secondary | ICD-10-CM | POA: Diagnosis not present

## 2022-07-10 DIAGNOSIS — N183 Chronic kidney disease, stage 3 unspecified: Secondary | ICD-10-CM | POA: Diagnosis not present

## 2022-07-10 DIAGNOSIS — N401 Enlarged prostate with lower urinary tract symptoms: Secondary | ICD-10-CM | POA: Diagnosis not present

## 2022-08-11 DIAGNOSIS — L409 Psoriasis, unspecified: Secondary | ICD-10-CM | POA: Diagnosis not present

## 2022-08-11 DIAGNOSIS — M79642 Pain in left hand: Secondary | ICD-10-CM | POA: Diagnosis not present

## 2022-08-11 DIAGNOSIS — E663 Overweight: Secondary | ICD-10-CM | POA: Diagnosis not present

## 2022-08-11 DIAGNOSIS — M1991 Primary osteoarthritis, unspecified site: Secondary | ICD-10-CM | POA: Diagnosis not present

## 2022-08-11 DIAGNOSIS — Z6827 Body mass index (BMI) 27.0-27.9, adult: Secondary | ICD-10-CM | POA: Diagnosis not present

## 2022-08-11 DIAGNOSIS — L4050 Arthropathic psoriasis, unspecified: Secondary | ICD-10-CM | POA: Diagnosis not present

## 2022-08-11 DIAGNOSIS — M79641 Pain in right hand: Secondary | ICD-10-CM | POA: Diagnosis not present

## 2022-08-17 DIAGNOSIS — R3915 Urgency of urination: Secondary | ICD-10-CM | POA: Diagnosis not present

## 2022-08-17 DIAGNOSIS — R351 Nocturia: Secondary | ICD-10-CM | POA: Diagnosis not present

## 2022-08-17 DIAGNOSIS — N401 Enlarged prostate with lower urinary tract symptoms: Secondary | ICD-10-CM | POA: Diagnosis not present

## 2022-08-19 DIAGNOSIS — L4050 Arthropathic psoriasis, unspecified: Secondary | ICD-10-CM | POA: Diagnosis not present

## 2022-08-19 DIAGNOSIS — F332 Major depressive disorder, recurrent severe without psychotic features: Secondary | ICD-10-CM | POA: Diagnosis not present

## 2022-08-19 DIAGNOSIS — B351 Tinea unguium: Secondary | ICD-10-CM | POA: Diagnosis not present

## 2022-09-03 ENCOUNTER — Ambulatory Visit: Payer: Medicare HMO | Admitting: Podiatry

## 2022-09-03 ENCOUNTER — Encounter: Payer: Self-pay | Admitting: Podiatry

## 2022-09-03 DIAGNOSIS — L603 Nail dystrophy: Secondary | ICD-10-CM | POA: Diagnosis not present

## 2022-09-03 DIAGNOSIS — L600 Ingrowing nail: Secondary | ICD-10-CM | POA: Diagnosis not present

## 2022-09-03 NOTE — Patient Instructions (Signed)

## 2022-09-03 NOTE — Progress Notes (Signed)
  Subjective:  Patient ID: [name] Norris, male    DOB: 1941-03-30,   MRN: [id number]  Chief Complaint  Patient presents with   Nail Problem     Left great toe nail, thickness & discoloration    82 y.o. male presents for concern of left great toenail thickness and discoloration. Relates it has been this way for years and causes pain when his soaks or shoes are too tight. Has not tried any treatments. Relates it has fallen off before and doesn't recall a specific injury but likely has damaged the nail in the past. He is currently taking methotrexate . Denies any other pedal complaints. Denies n/v/f/c.   Past Medical History:  Diagnosis Date   Back pain     Objective:  Physical Exam: Vascular: DP/PT pulses 2/4 bilateral. CFT <3 seconds. Normal hair growth on digits. No edema.  Skin. No lacerations or abrasions bilateral feet. Right hallux nail is dystrophic thick and discolored and tender to palpation.  Musculoskeletal: MMT 5/5 bilateral lower extremities in DF, PF, Inversion and Eversion. Deceased ROM in DF of ankle joint.  Neurological: Sensation intact to light touch.   Assessment:   1. Onychodystrophy   2. Ingrown right greater toenail      Plan:  Patient was evaluated and treated and all questions answered. Discussed ingrown toenails etiology and treatment options including procedure for removal vs conservative care.  Patient requesting removal of ingrown nail today. Procedure below.  Discussed procedure and post procedure care and patient expressed understanding.  Did discuss increased risk of infection due to being on methotrexate. Advised patient on signs of infection and to call immediately if any concern and will send in antibiotics.  Will follow-up in 2 weeks for nail check or sooner if an  problems arise.    Procedure:  Procedure: total Nail Avulsion of right hallux nail Surgeon: Lorenda Peck, DPM  Pre-op Dx: Ingrown dystrophic toenail without  infection Post-op: Same  Place of Surgery: Office exam room.  Indications for surgery: Painful and dystrophic toenail.    The patient is requesting removal of nail with chemical matrixectomy. Risks and complications were discussed with the patient for which they understand and written consent was obtained. Under sterile conditions a total of 3 mL of  1% lidocaine plain was infiltrated in a hallux block fashion. Once anesthetized, the skin was prepped in sterile fashion. A tourniquet was then applied. Next the entire right hallux nail was removed with hemostats.   Next phenol was then applied under standard conditions and copiously irrigated. Silvadene was applied. A dry sterile dressing was applied. After application of the dressing the tourniquet was removed and there is found to be an immediate capillary refill time to the digit. The patient tolerated the procedure well without any complications. Post procedure instructions were discussed the patient for which he verbally understood. Follow-up in two weeks for nail check or sooner if any problems are to arise. Discussed signs/symptoms of infection and directed to call the office immediately should any occur or go directly to the emergency room. In the meantime, encouraged to call the office with any questions, concerns, changes symptoms.   Lorenda Peck, DPM

## 2022-09-08 DIAGNOSIS — L4050 Arthropathic psoriasis, unspecified: Secondary | ICD-10-CM | POA: Diagnosis not present

## 2022-09-17 ENCOUNTER — Ambulatory Visit: Payer: Medicare HMO | Admitting: Podiatry

## 2022-09-17 ENCOUNTER — Encounter: Payer: Self-pay | Admitting: Podiatry

## 2022-09-17 DIAGNOSIS — L600 Ingrowing nail: Secondary | ICD-10-CM

## 2022-09-17 DIAGNOSIS — L603 Nail dystrophy: Secondary | ICD-10-CM

## 2022-09-17 NOTE — Progress Notes (Signed)
  Subjective:  Patient ID: [name] Norris, male    DOB: [birth year],   MRN: [id number]  No chief complaint on file.   82 y.o. male presents for follow-up of right great toenail avulsion. Relates doing well with minimal pain. Has been soaking as instructed . Denies any other pedal complaints. Denies n/v/f/c.   Past Medical History:  Diagnosis Date   Back pain     Objective:  Physical Exam: Vascular: DP/PT pulses 2/4 bilateral. CFT <3 seconds. Normal hair growth on digits. No edema.  Skin. No lacerations or abrasions bilateral feet. Right hallux nail bed is healing well.  Musculoskeletal: MMT 5/5 bilateral lower extremities in DF, PF, Inversion and Eversion. Deceased ROM in DF of ankle joint.  Neurological: Sensation intact to light touch.   Assessment:   1. Ingrown right greater toenail   2. Onychodystrophy      Plan:  Patient was evaluated and treated and all questions answered. Toe was evaluated and appears to be healing well.  May discontinue soaks and neosporin.  Patient to follow-up as needed.   Lorenda Peck, DPM

## 2022-11-17 DIAGNOSIS — L4050 Arthropathic psoriasis, unspecified: Secondary | ICD-10-CM | POA: Diagnosis not present

## 2022-11-17 DIAGNOSIS — Z79899 Other long term (current) drug therapy: Secondary | ICD-10-CM | POA: Diagnosis not present

## 2022-11-17 DIAGNOSIS — M79641 Pain in right hand: Secondary | ICD-10-CM | POA: Diagnosis not present

## 2022-11-17 DIAGNOSIS — M79642 Pain in left hand: Secondary | ICD-10-CM | POA: Diagnosis not present

## 2022-11-17 DIAGNOSIS — L409 Psoriasis, unspecified: Secondary | ICD-10-CM | POA: Diagnosis not present

## 2022-11-17 DIAGNOSIS — M545 Low back pain, unspecified: Secondary | ICD-10-CM | POA: Diagnosis not present

## 2022-11-17 DIAGNOSIS — M1991 Primary osteoarthritis, unspecified site: Secondary | ICD-10-CM | POA: Diagnosis not present

## 2022-12-15 DIAGNOSIS — L4050 Arthropathic psoriasis, unspecified: Secondary | ICD-10-CM | POA: Diagnosis not present

## 2023-03-04 DIAGNOSIS — R319 Hematuria, unspecified: Secondary | ICD-10-CM | POA: Diagnosis not present

## 2023-03-04 DIAGNOSIS — N183 Chronic kidney disease, stage 3 unspecified: Secondary | ICD-10-CM | POA: Diagnosis not present

## 2023-03-04 DIAGNOSIS — I700 Atherosclerosis of aorta: Secondary | ICD-10-CM | POA: Diagnosis not present

## 2023-03-12 DIAGNOSIS — Z9181 History of falling: Secondary | ICD-10-CM | POA: Diagnosis not present

## 2023-03-12 DIAGNOSIS — Z0000 Encounter for general adult medical examination without abnormal findings: Secondary | ICD-10-CM | POA: Diagnosis not present

## 2023-03-12 DIAGNOSIS — N189 Chronic kidney disease, unspecified: Secondary | ICD-10-CM | POA: Diagnosis not present

## 2023-03-12 DIAGNOSIS — R739 Hyperglycemia, unspecified: Secondary | ICD-10-CM | POA: Diagnosis not present

## 2023-03-12 DIAGNOSIS — G4733 Obstructive sleep apnea (adult) (pediatric): Secondary | ICD-10-CM | POA: Diagnosis not present

## 2023-03-12 DIAGNOSIS — E7800 Pure hypercholesterolemia, unspecified: Secondary | ICD-10-CM | POA: Diagnosis not present

## 2023-03-12 DIAGNOSIS — N401 Enlarged prostate with lower urinary tract symptoms: Secondary | ICD-10-CM | POA: Diagnosis not present

## 2023-03-12 DIAGNOSIS — L4050 Arthropathic psoriasis, unspecified: Secondary | ICD-10-CM | POA: Diagnosis not present

## 2023-03-30 DIAGNOSIS — N289 Disorder of kidney and ureter, unspecified: Secondary | ICD-10-CM | POA: Diagnosis not present

## 2023-03-30 DIAGNOSIS — N390 Urinary tract infection, site not specified: Secondary | ICD-10-CM | POA: Diagnosis not present

## 2023-04-27 DIAGNOSIS — N390 Urinary tract infection, site not specified: Secondary | ICD-10-CM | POA: Diagnosis not present

## 2023-06-17 DIAGNOSIS — R310 Gross hematuria: Secondary | ICD-10-CM | POA: Diagnosis not present

## 2023-06-17 DIAGNOSIS — N3941 Urge incontinence: Secondary | ICD-10-CM | POA: Diagnosis not present

## 2023-06-17 DIAGNOSIS — N401 Enlarged prostate with lower urinary tract symptoms: Secondary | ICD-10-CM | POA: Diagnosis not present

## 2023-06-18 DIAGNOSIS — K8689 Other specified diseases of pancreas: Secondary | ICD-10-CM | POA: Diagnosis not present

## 2023-06-18 DIAGNOSIS — K5730 Diverticulosis of large intestine without perforation or abscess without bleeding: Secondary | ICD-10-CM | POA: Diagnosis not present

## 2023-06-18 DIAGNOSIS — R310 Gross hematuria: Secondary | ICD-10-CM | POA: Diagnosis not present

## 2023-08-09 DIAGNOSIS — E039 Hypothyroidism, unspecified: Secondary | ICD-10-CM | POA: Diagnosis not present

## 2023-08-09 DIAGNOSIS — R197 Diarrhea, unspecified: Secondary | ICD-10-CM | POA: Diagnosis not present

## 2023-08-09 DIAGNOSIS — R109 Unspecified abdominal pain: Secondary | ICD-10-CM | POA: Diagnosis not present

## 2023-08-09 DIAGNOSIS — N189 Chronic kidney disease, unspecified: Secondary | ICD-10-CM | POA: Diagnosis not present

## 2023-08-10 DIAGNOSIS — R7309 Other abnormal glucose: Secondary | ICD-10-CM | POA: Diagnosis not present

## 2023-09-10 DIAGNOSIS — E038 Other specified hypothyroidism: Secondary | ICD-10-CM | POA: Diagnosis not present

## 2023-10-18 DIAGNOSIS — R3915 Urgency of urination: Secondary | ICD-10-CM | POA: Diagnosis not present

## 2023-10-18 DIAGNOSIS — R310 Gross hematuria: Secondary | ICD-10-CM | POA: Diagnosis not present

## 2023-10-18 DIAGNOSIS — R350 Frequency of micturition: Secondary | ICD-10-CM | POA: Diagnosis not present

## 2023-10-18 DIAGNOSIS — N401 Enlarged prostate with lower urinary tract symptoms: Secondary | ICD-10-CM | POA: Diagnosis not present

## 2023-10-26 DIAGNOSIS — E039 Hypothyroidism, unspecified: Secondary | ICD-10-CM | POA: Diagnosis not present

## 2023-10-26 DIAGNOSIS — Z0000 Encounter for general adult medical examination without abnormal findings: Secondary | ICD-10-CM | POA: Diagnosis not present

## 2023-10-26 DIAGNOSIS — L4050 Arthropathic psoriasis, unspecified: Secondary | ICD-10-CM | POA: Diagnosis not present

## 2023-10-26 DIAGNOSIS — E7439 Other disorders of intestinal carbohydrate absorption: Secondary | ICD-10-CM | POA: Diagnosis not present

## 2023-10-26 DIAGNOSIS — I10 Essential (primary) hypertension: Secondary | ICD-10-CM | POA: Diagnosis not present

## 2023-10-26 DIAGNOSIS — E782 Mixed hyperlipidemia: Secondary | ICD-10-CM | POA: Diagnosis not present

## 2023-10-26 DIAGNOSIS — N1831 Chronic kidney disease, stage 3a: Secondary | ICD-10-CM | POA: Diagnosis not present

## 2023-10-26 DIAGNOSIS — N400 Enlarged prostate without lower urinary tract symptoms: Secondary | ICD-10-CM | POA: Diagnosis not present

## 2023-11-15 DIAGNOSIS — E782 Mixed hyperlipidemia: Secondary | ICD-10-CM | POA: Diagnosis not present

## 2023-11-15 DIAGNOSIS — I10 Essential (primary) hypertension: Secondary | ICD-10-CM | POA: Diagnosis not present

## 2023-11-15 DIAGNOSIS — E039 Hypothyroidism, unspecified: Secondary | ICD-10-CM | POA: Diagnosis not present

## 2023-11-15 DIAGNOSIS — E7439 Other disorders of intestinal carbohydrate absorption: Secondary | ICD-10-CM | POA: Diagnosis not present

## 2023-11-15 DIAGNOSIS — N400 Enlarged prostate without lower urinary tract symptoms: Secondary | ICD-10-CM | POA: Diagnosis not present

## 2024-01-11 DIAGNOSIS — E7439 Other disorders of intestinal carbohydrate absorption: Secondary | ICD-10-CM | POA: Diagnosis not present

## 2024-01-11 DIAGNOSIS — E039 Hypothyroidism, unspecified: Secondary | ICD-10-CM | POA: Diagnosis not present

## 2024-01-11 DIAGNOSIS — R7302 Impaired glucose tolerance (oral): Secondary | ICD-10-CM | POA: Diagnosis not present

## 2024-01-12 DIAGNOSIS — N189 Chronic kidney disease, unspecified: Secondary | ICD-10-CM | POA: Diagnosis not present

## 2024-01-12 DIAGNOSIS — N183 Chronic kidney disease, stage 3 unspecified: Secondary | ICD-10-CM | POA: Diagnosis not present

## 2024-01-20 DIAGNOSIS — N401 Enlarged prostate with lower urinary tract symptoms: Secondary | ICD-10-CM | POA: Diagnosis not present

## 2024-01-20 DIAGNOSIS — N3941 Urge incontinence: Secondary | ICD-10-CM | POA: Diagnosis not present

## 2024-01-24 DIAGNOSIS — R350 Frequency of micturition: Secondary | ICD-10-CM | POA: Diagnosis not present

## 2024-02-10 DIAGNOSIS — E038 Other specified hypothyroidism: Secondary | ICD-10-CM | POA: Diagnosis not present

## 2024-02-10 DIAGNOSIS — L4050 Arthropathic psoriasis, unspecified: Secondary | ICD-10-CM | POA: Diagnosis not present

## 2024-02-10 DIAGNOSIS — F332 Major depressive disorder, recurrent severe without psychotic features: Secondary | ICD-10-CM | POA: Diagnosis not present

## 2024-02-10 DIAGNOSIS — N183 Chronic kidney disease, stage 3 unspecified: Secondary | ICD-10-CM | POA: Diagnosis not present

## 2024-02-10 DIAGNOSIS — E039 Hypothyroidism, unspecified: Secondary | ICD-10-CM | POA: Diagnosis not present

## 2024-02-10 DIAGNOSIS — N189 Chronic kidney disease, unspecified: Secondary | ICD-10-CM | POA: Diagnosis not present

## 2024-03-09 DIAGNOSIS — N3941 Urge incontinence: Secondary | ICD-10-CM | POA: Diagnosis not present

## 2024-03-09 DIAGNOSIS — N401 Enlarged prostate with lower urinary tract symptoms: Secondary | ICD-10-CM | POA: Diagnosis not present

## 2024-03-11 DIAGNOSIS — N183 Chronic kidney disease, stage 3 unspecified: Secondary | ICD-10-CM | POA: Diagnosis not present

## 2024-03-11 DIAGNOSIS — N189 Chronic kidney disease, unspecified: Secondary | ICD-10-CM | POA: Diagnosis not present

## 2024-03-12 DIAGNOSIS — E038 Other specified hypothyroidism: Secondary | ICD-10-CM | POA: Diagnosis not present

## 2024-03-12 DIAGNOSIS — F332 Major depressive disorder, recurrent severe without psychotic features: Secondary | ICD-10-CM | POA: Diagnosis not present

## 2024-03-12 DIAGNOSIS — N189 Chronic kidney disease, unspecified: Secondary | ICD-10-CM | POA: Diagnosis not present

## 2024-03-12 DIAGNOSIS — N183 Chronic kidney disease, stage 3 unspecified: Secondary | ICD-10-CM | POA: Diagnosis not present

## 2024-03-12 DIAGNOSIS — L4050 Arthropathic psoriasis, unspecified: Secondary | ICD-10-CM | POA: Diagnosis not present

## 2024-03-12 DIAGNOSIS — E039 Hypothyroidism, unspecified: Secondary | ICD-10-CM | POA: Diagnosis not present

## 2024-04-10 DIAGNOSIS — N189 Chronic kidney disease, unspecified: Secondary | ICD-10-CM | POA: Diagnosis not present

## 2024-04-10 DIAGNOSIS — N183 Chronic kidney disease, stage 3 unspecified: Secondary | ICD-10-CM | POA: Diagnosis not present

## 2024-04-11 DIAGNOSIS — N183 Chronic kidney disease, stage 3 unspecified: Secondary | ICD-10-CM | POA: Diagnosis not present

## 2024-04-11 DIAGNOSIS — F332 Major depressive disorder, recurrent severe without psychotic features: Secondary | ICD-10-CM | POA: Diagnosis not present

## 2024-04-11 DIAGNOSIS — E039 Hypothyroidism, unspecified: Secondary | ICD-10-CM | POA: Diagnosis not present

## 2024-04-11 DIAGNOSIS — N189 Chronic kidney disease, unspecified: Secondary | ICD-10-CM | POA: Diagnosis not present

## 2024-04-11 DIAGNOSIS — E038 Other specified hypothyroidism: Secondary | ICD-10-CM | POA: Diagnosis not present

## 2024-04-11 DIAGNOSIS — L4050 Arthropathic psoriasis, unspecified: Secondary | ICD-10-CM | POA: Diagnosis not present

## 2024-05-10 DIAGNOSIS — N183 Chronic kidney disease, stage 3 unspecified: Secondary | ICD-10-CM | POA: Diagnosis not present

## 2024-05-10 DIAGNOSIS — N189 Chronic kidney disease, unspecified: Secondary | ICD-10-CM | POA: Diagnosis not present

## 2024-05-12 DIAGNOSIS — F332 Major depressive disorder, recurrent severe without psychotic features: Secondary | ICD-10-CM | POA: Diagnosis not present

## 2024-05-12 DIAGNOSIS — N189 Chronic kidney disease, unspecified: Secondary | ICD-10-CM | POA: Diagnosis not present

## 2024-05-12 DIAGNOSIS — E039 Hypothyroidism, unspecified: Secondary | ICD-10-CM | POA: Diagnosis not present

## 2024-05-12 DIAGNOSIS — L4050 Arthropathic psoriasis, unspecified: Secondary | ICD-10-CM | POA: Diagnosis not present

## 2024-05-12 DIAGNOSIS — E038 Other specified hypothyroidism: Secondary | ICD-10-CM | POA: Diagnosis not present

## 2024-05-12 DIAGNOSIS — N183 Chronic kidney disease, stage 3 unspecified: Secondary | ICD-10-CM | POA: Diagnosis not present

## 2024-06-11 DIAGNOSIS — L4050 Arthropathic psoriasis, unspecified: Secondary | ICD-10-CM | POA: Diagnosis not present

## 2024-06-11 DIAGNOSIS — E039 Hypothyroidism, unspecified: Secondary | ICD-10-CM | POA: Diagnosis not present

## 2024-06-11 DIAGNOSIS — E038 Other specified hypothyroidism: Secondary | ICD-10-CM | POA: Diagnosis not present

## 2024-06-11 DIAGNOSIS — F332 Major depressive disorder, recurrent severe without psychotic features: Secondary | ICD-10-CM | POA: Diagnosis not present
